# Patient Record
Sex: Female | Born: 1970 | Race: Black or African American | Hispanic: No | Marital: Married | Smoking: Never smoker
Health system: Southern US, Community
[De-identification: ages and names within clinical notes are randomized; demographics above are authoritative.]

---
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
## 2006-02-22 ENCOUNTER — Ambulatory Visit: Payer: Self-pay | Admitting: Family Medicine

## 2006-05-12 ENCOUNTER — Ambulatory Visit (HOSPITAL_COMMUNITY): Admission: RE | Admit: 2006-05-12 | Discharge: 2006-05-12 | Payer: Self-pay | Admitting: Gynecology

## 2006-05-18 ENCOUNTER — Ambulatory Visit: Payer: Self-pay | Admitting: Obstetrics & Gynecology

## 2006-06-08 ENCOUNTER — Ambulatory Visit: Payer: Self-pay | Admitting: Obstetrics & Gynecology

## 2006-06-22 ENCOUNTER — Ambulatory Visit: Payer: Self-pay | Admitting: Obstetrics & Gynecology

## 2006-06-23 ENCOUNTER — Ambulatory Visit: Payer: Self-pay | Admitting: Gynecology

## 2006-06-26 ENCOUNTER — Ambulatory Visit: Payer: Self-pay | Admitting: Obstetrics & Gynecology

## 2006-06-27 ENCOUNTER — Ambulatory Visit: Payer: Self-pay | Admitting: Obstetrics & Gynecology

## 2006-06-27 ENCOUNTER — Ambulatory Visit (HOSPITAL_COMMUNITY): Admission: RE | Admit: 2006-06-27 | Discharge: 2006-06-27 | Payer: Self-pay | Admitting: Obstetrics & Gynecology

## 2006-06-28 ENCOUNTER — Ambulatory Visit: Payer: Self-pay | Admitting: Family Medicine

## 2006-07-03 ENCOUNTER — Ambulatory Visit: Payer: Self-pay | Admitting: Family Medicine

## 2006-07-17 ENCOUNTER — Ambulatory Visit: Payer: Self-pay | Admitting: *Deleted

## 2006-07-20 ENCOUNTER — Ambulatory Visit (HOSPITAL_COMMUNITY): Admission: RE | Admit: 2006-07-20 | Discharge: 2006-07-20 | Payer: Self-pay | Admitting: Family Medicine

## 2006-07-31 ENCOUNTER — Ambulatory Visit: Payer: Self-pay | Admitting: Obstetrics & Gynecology

## 2006-08-07 ENCOUNTER — Other Ambulatory Visit: Payer: Self-pay | Admitting: Obstetrics & Gynecology

## 2006-08-07 ENCOUNTER — Encounter: Admission: RE | Admit: 2006-08-07 | Discharge: 2006-08-07 | Payer: Self-pay | Admitting: Obstetrics & Gynecology

## 2006-08-07 ENCOUNTER — Ambulatory Visit: Payer: Self-pay | Admitting: Obstetrics & Gynecology

## 2006-08-14 ENCOUNTER — Ambulatory Visit: Payer: Self-pay | Admitting: Obstetrics & Gynecology

## 2006-08-17 ENCOUNTER — Ambulatory Visit (HOSPITAL_COMMUNITY): Admission: RE | Admit: 2006-08-17 | Discharge: 2006-08-17 | Payer: Self-pay | Admitting: Family Medicine

## 2006-08-28 ENCOUNTER — Ambulatory Visit (HOSPITAL_COMMUNITY): Admission: RE | Admit: 2006-08-28 | Discharge: 2006-08-28 | Payer: Self-pay | Admitting: Family Medicine

## 2006-08-28 ENCOUNTER — Ambulatory Visit: Payer: Self-pay | Admitting: Obstetrics & Gynecology

## 2006-08-31 ENCOUNTER — Ambulatory Visit: Payer: Self-pay | Admitting: Family Medicine

## 2006-09-01 ENCOUNTER — Inpatient Hospital Stay (HOSPITAL_COMMUNITY): Admission: AD | Admit: 2006-09-01 | Discharge: 2006-09-04 | Payer: Self-pay | Admitting: Gynecology

## 2006-09-01 ENCOUNTER — Ambulatory Visit: Payer: Self-pay | Admitting: Gynecology

## 2006-09-01 ENCOUNTER — Encounter (INDEPENDENT_AMBULATORY_CARE_PROVIDER_SITE_OTHER): Payer: Self-pay | Admitting: Gynecology

## 2006-09-06 ENCOUNTER — Ambulatory Visit: Payer: Self-pay | Admitting: Physician Assistant

## 2006-09-06 ENCOUNTER — Inpatient Hospital Stay (HOSPITAL_COMMUNITY): Admission: AD | Admit: 2006-09-06 | Discharge: 2006-09-06 | Payer: Self-pay | Admitting: Gynecology

## 2007-03-01 ENCOUNTER — Emergency Department (HOSPITAL_COMMUNITY): Admission: EM | Admit: 2007-03-01 | Discharge: 2007-03-02 | Payer: Self-pay | Admitting: Emergency Medicine

## 2007-03-04 ENCOUNTER — Emergency Department (HOSPITAL_COMMUNITY): Admission: EM | Admit: 2007-03-04 | Discharge: 2007-03-04 | Payer: Self-pay | Admitting: Emergency Medicine

## 2007-03-13 ENCOUNTER — Ambulatory Visit: Payer: Self-pay | Admitting: Family Medicine

## 2007-03-15 ENCOUNTER — Ambulatory Visit: Payer: Self-pay | Admitting: *Deleted

## 2007-04-25 ENCOUNTER — Ambulatory Visit: Payer: Self-pay | Admitting: Family Medicine

## 2007-04-25 LAB — CONVERTED CEMR LAB
ALT: 21 units/L (ref 0–35)
AST: 20 units/L (ref 0–37)
Albumin: 4 g/dL (ref 3.5–5.2)
Alkaline Phosphatase: 72 units/L (ref 39–117)
Bilirubin, Direct: 0.1 mg/dL (ref 0.0–0.3)
Indirect Bilirubin: 0.3 mg/dL (ref 0.0–0.9)
Total Bilirubin: 0.4 mg/dL (ref 0.3–1.2)
Total Protein: 7.5 g/dL (ref 6.0–8.3)

## 2009-01-04 IMAGING — US US OB FOLLOW-UP EACH ADDL GEST (MODIFY)
1 series · 14 of 28 positions shown · non-contrast
Comparison: none

OBSTETRICAL ULTRASOUND:
 This ultrasound was performed in The [HOSPITAL], and the [facility] report will be stored to [REDACTED] PACS.

[Series 1: us ob follow-up each addl gest (modify) · 14 of 38 slices shown]
[im 2/38]
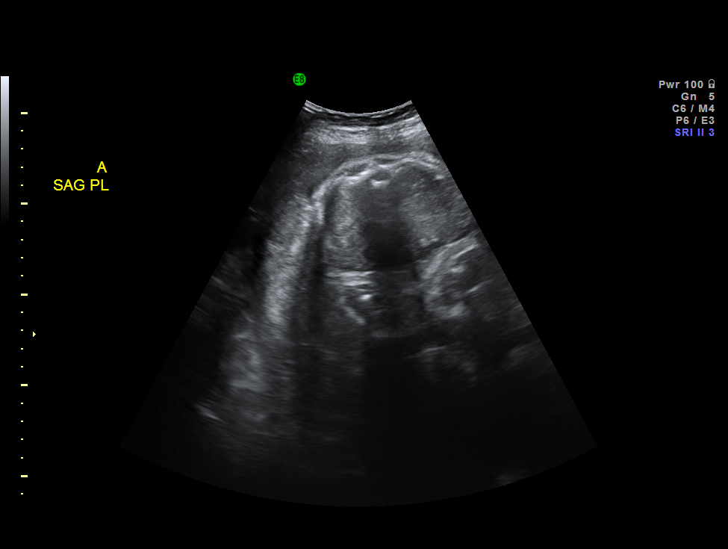
[im 5/38]
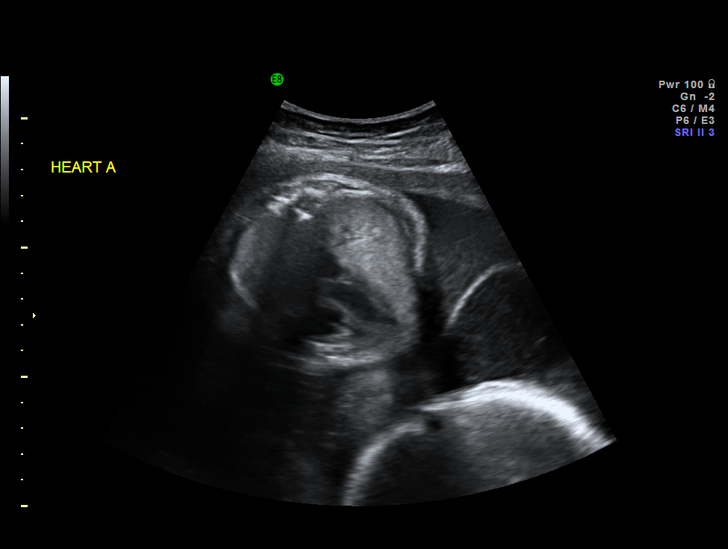
[im 7/38]
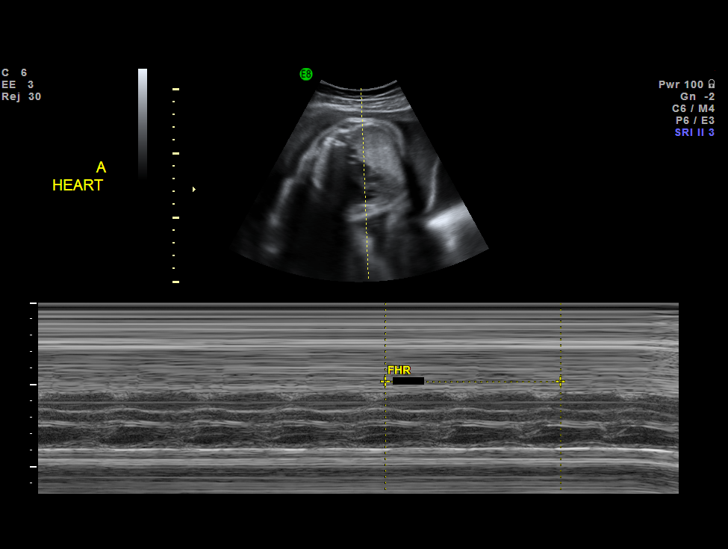
[im 10/38]
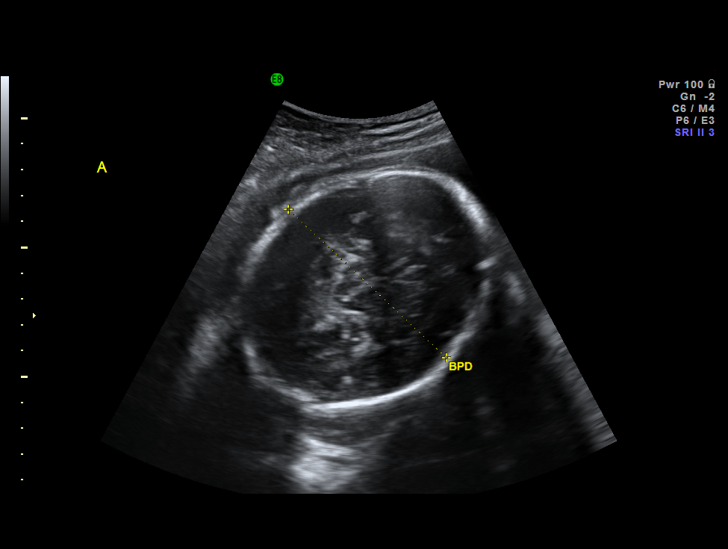
[im 13/38]
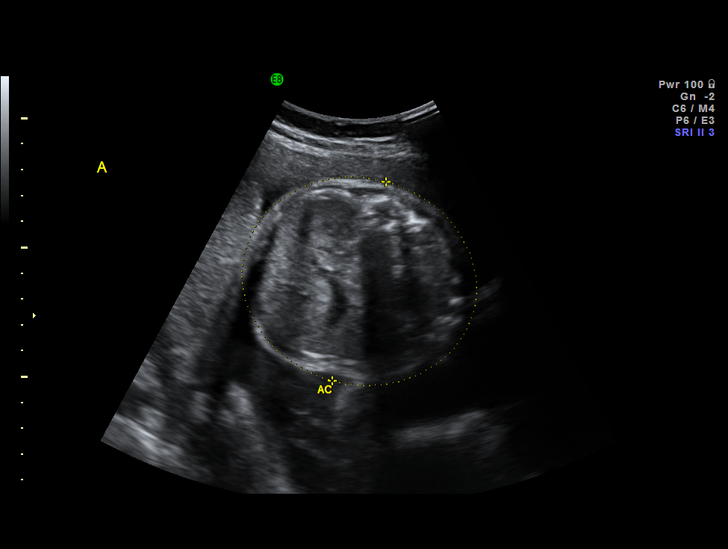
[im 16/38]
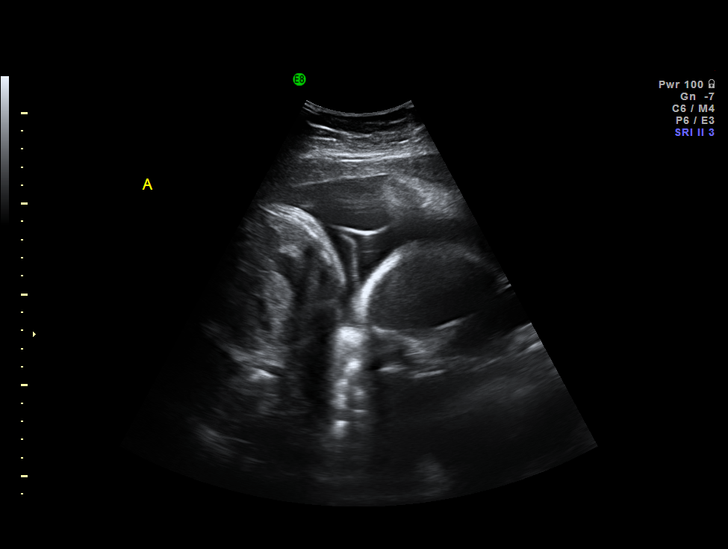
[im 18/38]
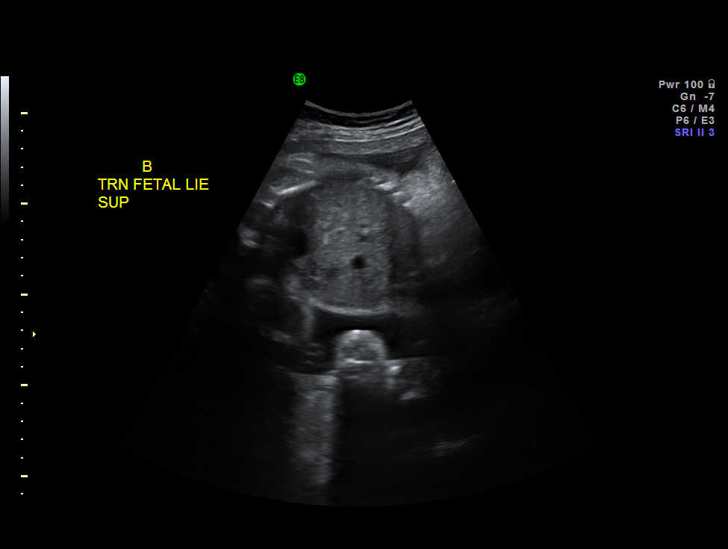
[im 21/38]
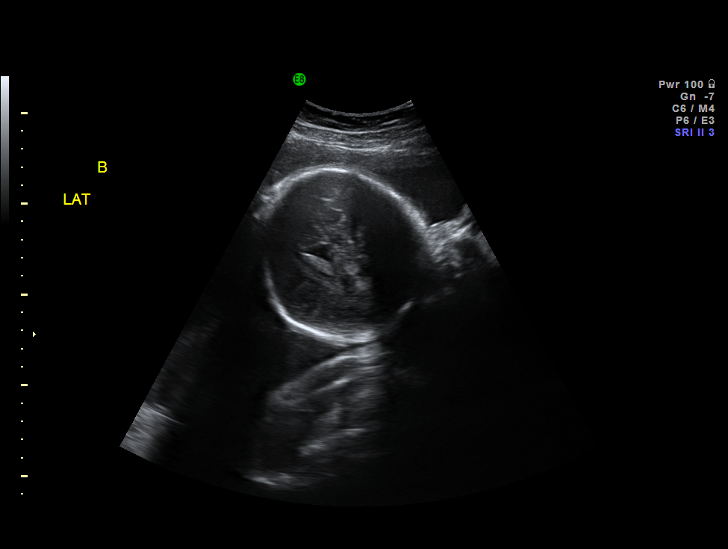
[im 24/38]
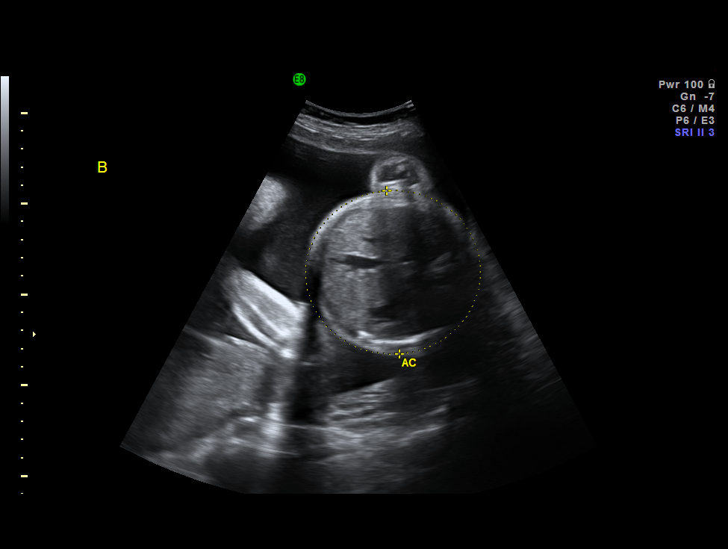
[im 27/38]
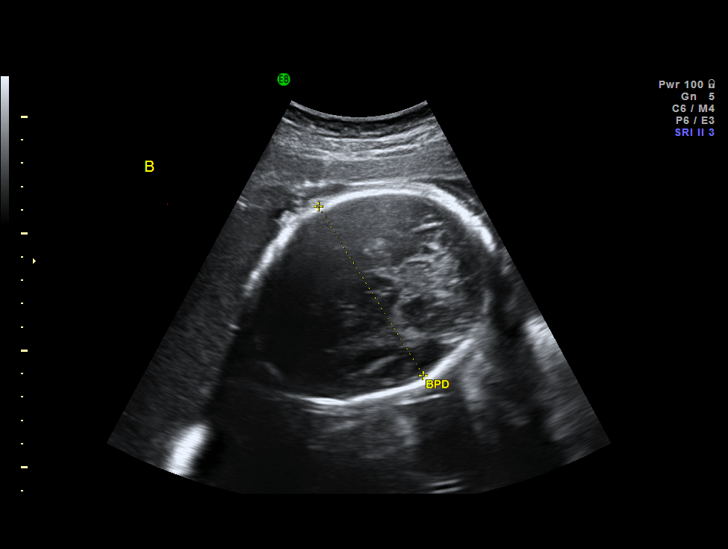
[im 29/38]
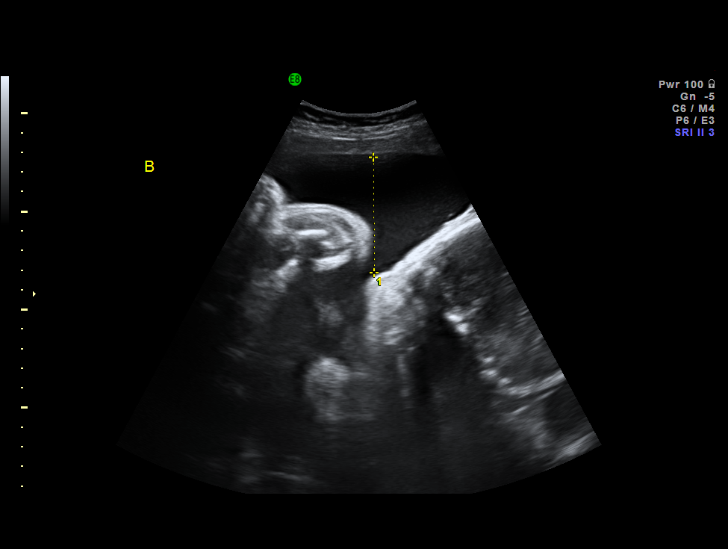
[im 32/38]
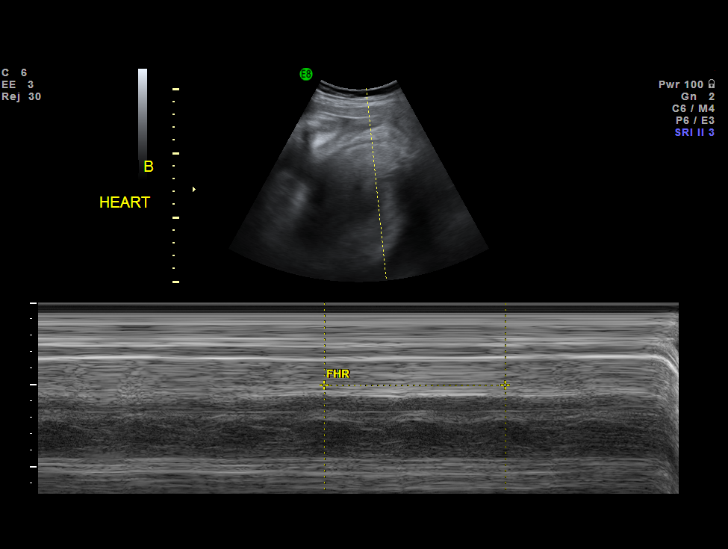
[im 35/38]
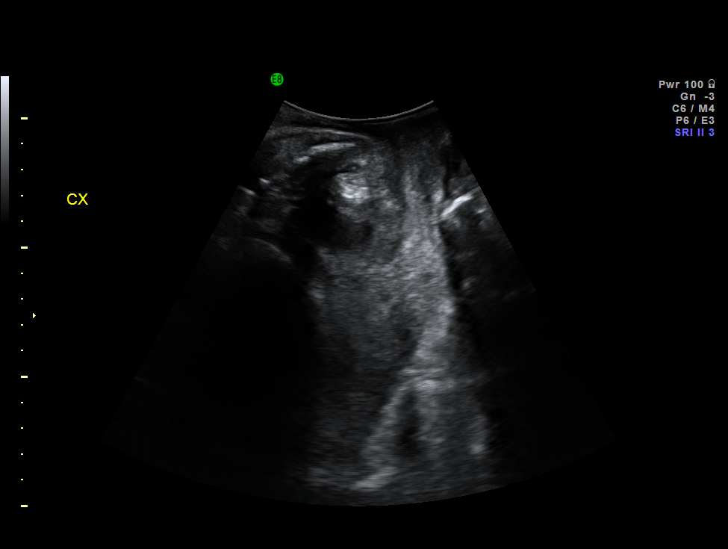
[im 38/38]
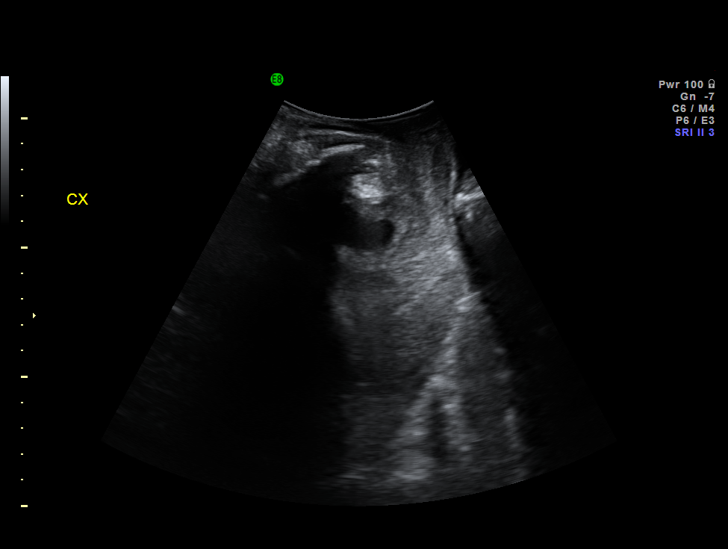

[14 of 28 positions shown; findings below may reference images not displayed]

IMPRESSION: The [facility] report has also been faxed to the ordering physician.

## 2009-01-09 ENCOUNTER — Ambulatory Visit: Payer: Self-pay | Admitting: Family Medicine

## 2009-01-09 LAB — CONVERTED CEMR LAB
ALT: 13 units/L (ref 0–35)
AST: 13 units/L (ref 0–37)
Albumin: 4 g/dL (ref 3.5–5.2)
Alkaline Phosphatase: 82 units/L (ref 39–117)
BUN: 8 mg/dL (ref 6–23)
Basophils Absolute: 0 10*3/uL (ref 0.0–0.1)
Basophils Relative: 1 % (ref 0–1)
CO2: 21 meq/L (ref 19–32)
Calcium: 9.3 mg/dL (ref 8.4–10.5)
Chloride: 102 meq/L (ref 96–112)
Creatinine, Ser: 0.55 mg/dL (ref 0.40–1.20)
Eosinophils Absolute: 0.1 10*3/uL (ref 0.0–0.7)
Eosinophils Relative: 1 % (ref 0–5)
Glucose, Bld: 86 mg/dL (ref 70–99)
HCT: 35.2 % — ABNORMAL LOW (ref 36.0–46.0)
Hemoglobin: 10.5 g/dL — ABNORMAL LOW (ref 12.0–15.0)
Lymphocytes Relative: 42 % (ref 12–46)
Lymphs Abs: 2.7 10*3/uL (ref 0.7–4.0)
MCHC: 29.8 g/dL — ABNORMAL LOW (ref 30.0–36.0)
MCV: 78.9 fL (ref 78.0–100.0)
Monocytes Absolute: 0.5 10*3/uL (ref 0.1–1.0)
Monocytes Relative: 7 % (ref 3–12)
Neutro Abs: 3.2 10*3/uL (ref 1.7–7.7)
Neutrophils Relative %: 50 % (ref 43–77)
Platelets: 487 10*3/uL — ABNORMAL HIGH (ref 150–400)
Potassium: 3.9 meq/L (ref 3.5–5.3)
RBC: 4.46 M/uL (ref 3.87–5.11)
RDW: 15.7 % — ABNORMAL HIGH (ref 11.5–15.5)
Sodium: 140 meq/L (ref 135–145)
Total Bilirubin: 0.3 mg/dL (ref 0.3–1.2)
Total Protein: 8.1 g/dL (ref 6.0–8.3)
Vit D, 25-Hydroxy: 9 ng/mL — ABNORMAL LOW (ref 30–89)
WBC: 6.5 10*3/uL (ref 4.0–10.5)

## 2009-04-10 ENCOUNTER — Ambulatory Visit: Payer: Self-pay | Admitting: Obstetrics and Gynecology

## 2010-01-29 ENCOUNTER — Ambulatory Visit (HOSPITAL_COMMUNITY)
Admission: RE | Admit: 2010-01-29 | Discharge: 2010-01-29 | Payer: Self-pay | Source: Home / Self Care | Admitting: Family Medicine

## 2010-02-04 ENCOUNTER — Ambulatory Visit: Payer: Self-pay | Admitting: Obstetrics & Gynecology

## 2010-02-25 ENCOUNTER — Ambulatory Visit: Payer: Self-pay | Admitting: Obstetrics & Gynecology

## 2010-03-01 ENCOUNTER — Ambulatory Visit
Admission: RE | Admit: 2010-03-01 | Discharge: 2010-03-01 | Payer: Self-pay | Source: Home / Self Care | Attending: Obstetrics & Gynecology | Admitting: Obstetrics & Gynecology

## 2010-03-02 ENCOUNTER — Encounter: Payer: Self-pay | Admitting: Obstetrics and Gynecology

## 2010-03-02 LAB — CONVERTED CEMR LAB
ALT: 9 units/L (ref 0–35)
AST: 12 units/L (ref 0–37)
Albumin: 3.3 g/dL — ABNORMAL LOW (ref 3.5–5.2)
Alkaline Phosphatase: 74 units/L (ref 39–117)
BUN: 5 mg/dL — ABNORMAL LOW (ref 6–23)
CO2: 21 meq/L (ref 19–32)
Calcium: 9.7 mg/dL (ref 8.4–10.5)
Chloride: 104 meq/L (ref 96–112)
Collection Interval-CRCL: 24 hr
Creatinine 24 HR UR: 2026 mg/24hr — ABNORMAL HIGH (ref 700–1800)
Creatinine Clearance: 293 mL/min — ABNORMAL HIGH (ref 75–115)
Creatinine, Ser: 0.48 mg/dL (ref 0.40–1.20)
Creatinine, Urine: 84.4 mg/dL
Glucose, Bld: 103 mg/dL — ABNORMAL HIGH (ref 70–99)
HCT: 30.4 % — ABNORMAL LOW (ref 36.0–46.0)
Hemoglobin: 9.8 g/dL — ABNORMAL LOW (ref 12.0–15.0)
MCHC: 32.2 g/dL (ref 30.0–36.0)
MCV: 88.1 fL (ref 78.0–100.0)
Platelets: 331 10*3/uL (ref 150–400)
Potassium: 4.2 meq/L (ref 3.5–5.3)
Protein, 24H Urine: 96 mg/24hr (ref 50–100)
RBC: 3.45 M/uL — ABNORMAL LOW (ref 3.87–5.11)
RDW: 14.8 % (ref 11.5–15.5)
Sodium: 137 meq/L (ref 135–145)
Total Bilirubin: 0.3 mg/dL (ref 0.3–1.2)
Total Protein: 6.7 g/dL (ref 6.0–8.3)
Uric Acid, Serum: 4.8 mg/dL (ref 2.4–7.0)
WBC: 11.7 10*3/uL — ABNORMAL HIGH (ref 4.0–10.5)

## 2010-03-04 ENCOUNTER — Ambulatory Visit
Admission: RE | Admit: 2010-03-04 | Discharge: 2010-03-04 | Payer: Self-pay | Source: Home / Self Care | Attending: Obstetrics & Gynecology | Admitting: Obstetrics & Gynecology

## 2010-03-04 LAB — POCT URINALYSIS DIPSTICK
Bilirubin Urine: NEGATIVE
Hemoglobin, Urine: NEGATIVE
Ketones, ur: NEGATIVE mg/dL
Nitrite: NEGATIVE
Protein, ur: 30 mg/dL — AB
Urine Glucose, Fasting: NEGATIVE mg/dL
Urobilinogen, UA: 1 mg/dL (ref 0.0–1.0)
pH: 6 (ref 5.0–8.0)

## 2010-03-18 ENCOUNTER — Encounter: Payer: Self-pay | Admitting: Obstetrics & Gynecology

## 2010-03-18 ENCOUNTER — Ambulatory Visit
Admission: RE | Admit: 2010-03-18 | Discharge: 2010-03-18 | Payer: Self-pay | Source: Home / Self Care | Attending: Obstetrics and Gynecology | Admitting: Obstetrics and Gynecology

## 2010-03-18 LAB — CONVERTED CEMR LAB
HCT: 31.2 % — ABNORMAL LOW (ref 36.0–46.0)
HIV: NONREACTIVE
Hemoglobin: 9.8 g/dL — ABNORMAL LOW (ref 12.0–15.0)
MCHC: 31.4 g/dL (ref 30.0–36.0)
MCV: 88.4 fL (ref 78.0–100.0)
Platelets: 305 10*3/uL (ref 150–400)
RBC: 3.53 M/uL — ABNORMAL LOW (ref 3.87–5.11)
RDW: 14.9 % (ref 11.5–15.5)
WBC: 11 10*3/uL — ABNORMAL HIGH (ref 4.0–10.5)

## 2010-03-21 ENCOUNTER — Encounter: Payer: Self-pay | Admitting: *Deleted

## 2010-03-22 LAB — POCT URINALYSIS DIPSTICK
Bilirubin Urine: NEGATIVE
Hgb urine dipstick: NEGATIVE
Ketones, ur: NEGATIVE mg/dL
Nitrite: NEGATIVE
Protein, ur: NEGATIVE mg/dL
Specific Gravity, Urine: 1.025 (ref 1.005–1.030)
Urine Glucose, Fasting: NEGATIVE mg/dL
Urobilinogen, UA: 0.2 mg/dL (ref 0.0–1.0)
pH: 6 (ref 5.0–8.0)

## 2010-03-24 ENCOUNTER — Ambulatory Visit (HOSPITAL_COMMUNITY)
Admission: RE | Admit: 2010-03-24 | Discharge: 2010-03-24 | Payer: Self-pay | Source: Home / Self Care | Attending: Family Medicine | Admitting: Family Medicine

## 2010-03-29 ENCOUNTER — Ambulatory Visit
Admission: RE | Admit: 2010-03-29 | Discharge: 2010-03-29 | Payer: Self-pay | Source: Home / Self Care | Attending: Obstetrics & Gynecology | Admitting: Obstetrics & Gynecology

## 2010-03-29 ENCOUNTER — Inpatient Hospital Stay (HOSPITAL_COMMUNITY)
Admission: AD | Admit: 2010-03-29 | Discharge: 2010-03-29 | Payer: Self-pay | Source: Home / Self Care | Attending: Family Medicine | Admitting: Family Medicine

## 2010-03-29 LAB — URINALYSIS, ROUTINE W REFLEX MICROSCOPIC
Bilirubin Urine: NEGATIVE
Hgb urine dipstick: NEGATIVE
Ketones, ur: >80 mg/dL — AB
Nitrite: NEGATIVE
Protein, ur: NEGATIVE mg/dL
Specific Gravity, Urine: 1.025 (ref 1.005–1.030)
Urine Glucose, Fasting: NEGATIVE mg/dL
Urobilinogen, UA: 0.2 mg/dL (ref 0.0–1.0)
pH: 5.5 (ref 5.0–8.0)

## 2010-04-01 ENCOUNTER — Ambulatory Visit: Admit: 2010-04-01 | Payer: Self-pay | Admitting: Obstetrics and Gynecology

## 2010-04-01 ENCOUNTER — Other Ambulatory Visit: Payer: Self-pay

## 2010-04-01 DIAGNOSIS — O409XX0 Polyhydramnios, unspecified trimester, not applicable or unspecified: Secondary | ICD-10-CM

## 2010-04-01 DIAGNOSIS — O09529 Supervision of elderly multigravida, unspecified trimester: Secondary | ICD-10-CM

## 2010-04-01 LAB — POCT URINALYSIS DIPSTICK
Bilirubin Urine: NEGATIVE
Hgb urine dipstick: NEGATIVE
Ketones, ur: NEGATIVE mg/dL
Nitrite: NEGATIVE
Protein, ur: NEGATIVE mg/dL
Specific Gravity, Urine: 1.015 (ref 1.005–1.030)
Urine Glucose, Fasting: NEGATIVE mg/dL
Urobilinogen, UA: 0.2 mg/dL (ref 0.0–1.0)
pH: 6.5 (ref 5.0–8.0)

## 2010-04-05 ENCOUNTER — Encounter: Payer: Medicaid Other | Attending: Obstetrics and Gynecology | Admitting: Dietician

## 2010-04-05 DIAGNOSIS — Z713 Dietary counseling and surveillance: Secondary | ICD-10-CM | POA: Insufficient documentation

## 2010-04-05 DIAGNOSIS — O99810 Abnormal glucose complicating pregnancy: Secondary | ICD-10-CM | POA: Insufficient documentation

## 2010-04-08 ENCOUNTER — Other Ambulatory Visit: Payer: Self-pay

## 2010-04-08 ENCOUNTER — Other Ambulatory Visit (HOSPITAL_COMMUNITY): Payer: Self-pay | Admitting: Maternal and Fetal Medicine

## 2010-04-08 ENCOUNTER — Other Ambulatory Visit: Payer: Self-pay | Admitting: Family Medicine

## 2010-04-08 ENCOUNTER — Ambulatory Visit (HOSPITAL_COMMUNITY)
Admission: RE | Admit: 2010-04-08 | Discharge: 2010-04-08 | Disposition: A | Discharge status: Home / Self Care | Payer: Medicaid Other | Source: Ambulatory Visit | Attending: Family Medicine | Admitting: Family Medicine

## 2010-04-08 ENCOUNTER — Ambulatory Visit (HOSPITAL_COMMUNITY): Payer: Self-pay

## 2010-04-08 DIAGNOSIS — O34219 Maternal care for unspecified type scar from previous cesarean delivery: Secondary | ICD-10-CM | POA: Insufficient documentation

## 2010-04-08 DIAGNOSIS — O24919 Unspecified diabetes mellitus in pregnancy, unspecified trimester: Secondary | ICD-10-CM

## 2010-04-08 DIAGNOSIS — O99810 Abnormal glucose complicating pregnancy: Secondary | ICD-10-CM

## 2010-04-08 DIAGNOSIS — O09299 Supervision of pregnancy with other poor reproductive or obstetric history, unspecified trimester: Secondary | ICD-10-CM | POA: Insufficient documentation

## 2010-04-08 DIAGNOSIS — E669 Obesity, unspecified: Secondary | ICD-10-CM | POA: Insufficient documentation

## 2010-04-08 DIAGNOSIS — O09529 Supervision of elderly multigravida, unspecified trimester: Secondary | ICD-10-CM | POA: Insufficient documentation

## 2010-04-08 DIAGNOSIS — O99210 Obesity complicating pregnancy, unspecified trimester: Secondary | ICD-10-CM | POA: Insufficient documentation

## 2010-04-08 DIAGNOSIS — Z8751 Personal history of pre-term labor: Secondary | ICD-10-CM | POA: Insufficient documentation

## 2010-04-08 LAB — POCT URINALYSIS DIPSTICK
Hgb urine dipstick: NEGATIVE
Nitrite: NEGATIVE
Protein, ur: 30 mg/dL — AB
Specific Gravity, Urine: 1.025 (ref 1.005–1.030)
Urine Glucose, Fasting: NEGATIVE mg/dL
Urobilinogen, UA: 1 mg/dL (ref 0.0–1.0)
pH: 6 (ref 5.0–8.0)

## 2010-04-15 ENCOUNTER — Other Ambulatory Visit (HOSPITAL_COMMUNITY): Payer: Self-pay

## 2010-04-16 ENCOUNTER — Encounter (HOSPITAL_COMMUNITY): Payer: Self-pay

## 2010-04-16 ENCOUNTER — Other Ambulatory Visit (HOSPITAL_COMMUNITY): Payer: Self-pay

## 2010-04-16 ENCOUNTER — Other Ambulatory Visit: Payer: Self-pay | Admitting: Family Medicine

## 2010-04-16 ENCOUNTER — Ambulatory Visit (HOSPITAL_COMMUNITY)
Admission: RE | Admit: 2010-04-16 | Discharge: 2010-04-16 | Disposition: A | Discharge status: Home / Self Care | Payer: Medicaid Other | Source: Ambulatory Visit | Attending: Maternal and Fetal Medicine | Admitting: Maternal and Fetal Medicine

## 2010-04-16 DIAGNOSIS — O34219 Maternal care for unspecified type scar from previous cesarean delivery: Secondary | ICD-10-CM | POA: Insufficient documentation

## 2010-04-16 DIAGNOSIS — O99210 Obesity complicating pregnancy, unspecified trimester: Secondary | ICD-10-CM | POA: Insufficient documentation

## 2010-04-16 DIAGNOSIS — O09529 Supervision of elderly multigravida, unspecified trimester: Secondary | ICD-10-CM | POA: Insufficient documentation

## 2010-04-16 DIAGNOSIS — O99810 Abnormal glucose complicating pregnancy: Secondary | ICD-10-CM | POA: Insufficient documentation

## 2010-04-16 DIAGNOSIS — E669 Obesity, unspecified: Secondary | ICD-10-CM | POA: Insufficient documentation

## 2010-04-20 ENCOUNTER — Ambulatory Visit (HOSPITAL_COMMUNITY)
Admission: RE | Admit: 2010-04-20 | Discharge: 2010-04-20 | Disposition: A | Discharge status: Home / Self Care | Payer: Medicaid Other | Source: Ambulatory Visit | Attending: Obstetrics & Gynecology | Admitting: Obstetrics & Gynecology

## 2010-04-20 DIAGNOSIS — O09529 Supervision of elderly multigravida, unspecified trimester: Secondary | ICD-10-CM | POA: Insufficient documentation

## 2010-04-20 DIAGNOSIS — O99810 Abnormal glucose complicating pregnancy: Secondary | ICD-10-CM | POA: Insufficient documentation

## 2010-04-20 DIAGNOSIS — E669 Obesity, unspecified: Secondary | ICD-10-CM | POA: Insufficient documentation

## 2010-04-20 DIAGNOSIS — O34219 Maternal care for unspecified type scar from previous cesarean delivery: Secondary | ICD-10-CM | POA: Insufficient documentation

## 2010-04-22 ENCOUNTER — Ambulatory Visit (HOSPITAL_COMMUNITY): Payer: Self-pay

## 2010-04-23 ENCOUNTER — Ambulatory Visit (HOSPITAL_COMMUNITY)
Admission: RE | Admit: 2010-04-23 | Discharge: 2010-04-23 | Disposition: A | Discharge status: Home / Self Care | Payer: Medicaid Other | Source: Ambulatory Visit | Attending: Family Medicine | Admitting: Family Medicine

## 2010-04-23 ENCOUNTER — Ambulatory Visit (HOSPITAL_COMMUNITY)
Admission: RE | Admit: 2010-04-23 | Discharge: 2010-04-23 | Disposition: A | Discharge status: Home / Self Care | Payer: Medicaid Other | Source: Ambulatory Visit | Attending: Obstetrics & Gynecology | Admitting: Obstetrics & Gynecology

## 2010-04-23 DIAGNOSIS — Z8751 Personal history of pre-term labor: Secondary | ICD-10-CM | POA: Insufficient documentation

## 2010-04-23 DIAGNOSIS — O34219 Maternal care for unspecified type scar from previous cesarean delivery: Secondary | ICD-10-CM | POA: Insufficient documentation

## 2010-04-23 DIAGNOSIS — O99810 Abnormal glucose complicating pregnancy: Secondary | ICD-10-CM | POA: Insufficient documentation

## 2010-04-23 DIAGNOSIS — O99210 Obesity complicating pregnancy, unspecified trimester: Secondary | ICD-10-CM | POA: Insufficient documentation

## 2010-04-23 DIAGNOSIS — O09529 Supervision of elderly multigravida, unspecified trimester: Secondary | ICD-10-CM

## 2010-04-23 DIAGNOSIS — E669 Obesity, unspecified: Secondary | ICD-10-CM | POA: Insufficient documentation

## 2010-04-23 DIAGNOSIS — O09299 Supervision of pregnancy with other poor reproductive or obstetric history, unspecified trimester: Secondary | ICD-10-CM | POA: Insufficient documentation

## 2010-04-26 ENCOUNTER — Other Ambulatory Visit: Payer: Self-pay

## 2010-04-26 ENCOUNTER — Encounter: Payer: Self-pay | Admitting: Obstetrics & Gynecology

## 2010-04-26 DIAGNOSIS — IMO0002 Reserved for concepts with insufficient information to code with codable children: Secondary | ICD-10-CM

## 2010-04-26 DIAGNOSIS — O409XX0 Polyhydramnios, unspecified trimester, not applicable or unspecified: Secondary | ICD-10-CM

## 2010-04-26 DIAGNOSIS — O34219 Maternal care for unspecified type scar from previous cesarean delivery: Secondary | ICD-10-CM

## 2010-04-26 DIAGNOSIS — O09529 Supervision of elderly multigravida, unspecified trimester: Secondary | ICD-10-CM

## 2010-04-26 LAB — POCT URINALYSIS DIPSTICK
Nitrite: NEGATIVE
Protein, ur: 30 mg/dL — AB
Urobilinogen, UA: 1 mg/dL (ref 0.0–1.0)

## 2010-04-26 LAB — CONVERTED CEMR LAB: TSH: 1.291 microintl units/mL (ref 0.350–4.500)

## 2010-04-27 ENCOUNTER — Ambulatory Visit (HOSPITAL_COMMUNITY)
Admission: RE | Admit: 2010-04-27 | Discharge: 2010-04-27 | Disposition: A | Discharge status: Home / Self Care | Payer: Medicaid Other | Source: Ambulatory Visit | Attending: Family Medicine | Admitting: Family Medicine

## 2010-04-27 ENCOUNTER — Ambulatory Visit (HOSPITAL_COMMUNITY): Payer: Medicaid Other

## 2010-04-27 ENCOUNTER — Other Ambulatory Visit: Payer: Self-pay | Admitting: Family Medicine

## 2010-04-27 ENCOUNTER — Ambulatory Visit (HOSPITAL_COMMUNITY)
Admission: RE | Admit: 2010-04-27 | Discharge: 2010-04-27 | Disposition: A | Discharge status: Home / Self Care | Payer: Medicaid Other | Source: Ambulatory Visit | Attending: Obstetrics & Gynecology | Admitting: Obstetrics & Gynecology

## 2010-04-27 DIAGNOSIS — O09529 Supervision of elderly multigravida, unspecified trimester: Secondary | ICD-10-CM | POA: Insufficient documentation

## 2010-04-27 DIAGNOSIS — O99810 Abnormal glucose complicating pregnancy: Secondary | ICD-10-CM | POA: Insufficient documentation

## 2010-04-27 DIAGNOSIS — E669 Obesity, unspecified: Secondary | ICD-10-CM | POA: Insufficient documentation

## 2010-04-27 DIAGNOSIS — O289 Unspecified abnormal findings on antenatal screening of mother: Secondary | ICD-10-CM

## 2010-04-27 DIAGNOSIS — Z8751 Personal history of pre-term labor: Secondary | ICD-10-CM | POA: Insufficient documentation

## 2010-04-27 DIAGNOSIS — O09299 Supervision of pregnancy with other poor reproductive or obstetric history, unspecified trimester: Secondary | ICD-10-CM | POA: Insufficient documentation

## 2010-04-29 ENCOUNTER — Ambulatory Visit (HOSPITAL_COMMUNITY): Payer: Self-pay

## 2010-04-30 ENCOUNTER — Ambulatory Visit (HOSPITAL_COMMUNITY)
Admission: RE | Admit: 2010-04-30 | Discharge: 2010-04-30 | Disposition: A | Discharge status: Home / Self Care | Payer: Medicaid Other | Source: Ambulatory Visit | Attending: Family Medicine | Admitting: Family Medicine

## 2010-04-30 ENCOUNTER — Other Ambulatory Visit: Payer: Self-pay | Admitting: Family Medicine

## 2010-04-30 ENCOUNTER — Ambulatory Visit (HOSPITAL_COMMUNITY)
Admission: RE | Admit: 2010-04-30 | Discharge: 2010-04-30 | Disposition: A | Discharge status: Home / Self Care | Payer: Medicaid Other | Source: Ambulatory Visit | Attending: Obstetrics & Gynecology | Admitting: Obstetrics & Gynecology

## 2010-04-30 DIAGNOSIS — O09299 Supervision of pregnancy with other poor reproductive or obstetric history, unspecified trimester: Secondary | ICD-10-CM | POA: Insufficient documentation

## 2010-04-30 DIAGNOSIS — O09529 Supervision of elderly multigravida, unspecified trimester: Secondary | ICD-10-CM | POA: Insufficient documentation

## 2010-04-30 DIAGNOSIS — O99210 Obesity complicating pregnancy, unspecified trimester: Secondary | ICD-10-CM | POA: Insufficient documentation

## 2010-04-30 DIAGNOSIS — O34219 Maternal care for unspecified type scar from previous cesarean delivery: Secondary | ICD-10-CM

## 2010-04-30 DIAGNOSIS — O99810 Abnormal glucose complicating pregnancy: Secondary | ICD-10-CM

## 2010-04-30 DIAGNOSIS — E669 Obesity, unspecified: Secondary | ICD-10-CM

## 2010-04-30 DIAGNOSIS — O288 Other abnormal findings on antenatal screening of mother: Secondary | ICD-10-CM

## 2010-05-03 ENCOUNTER — Other Ambulatory Visit: Payer: Self-pay

## 2010-05-03 ENCOUNTER — Encounter: Payer: Medicaid Other | Attending: Obstetrics and Gynecology | Admitting: Dietician

## 2010-05-03 DIAGNOSIS — Z713 Dietary counseling and surveillance: Secondary | ICD-10-CM | POA: Insufficient documentation

## 2010-05-03 DIAGNOSIS — O409XX0 Polyhydramnios, unspecified trimester, not applicable or unspecified: Secondary | ICD-10-CM

## 2010-05-03 DIAGNOSIS — O99810 Abnormal glucose complicating pregnancy: Secondary | ICD-10-CM | POA: Insufficient documentation

## 2010-05-03 LAB — POCT URINALYSIS DIPSTICK
Glucose, UA: NEGATIVE mg/dL
Nitrite: NEGATIVE
Protein, ur: NEGATIVE mg/dL
Urobilinogen, UA: 1 mg/dL (ref 0.0–1.0)

## 2010-05-04 ENCOUNTER — Other Ambulatory Visit: Payer: Self-pay | Admitting: Family Medicine

## 2010-05-04 ENCOUNTER — Other Ambulatory Visit (HOSPITAL_COMMUNITY): Payer: Medicaid Other

## 2010-05-04 ENCOUNTER — Ambulatory Visit (HOSPITAL_COMMUNITY)
Admission: RE | Admit: 2010-05-04 | Discharge: 2010-05-04 | Disposition: A | Discharge status: Home / Self Care | Payer: Medicaid Other | Source: Ambulatory Visit | Attending: Family Medicine | Admitting: Family Medicine

## 2010-05-04 ENCOUNTER — Ambulatory Visit (HOSPITAL_COMMUNITY): Payer: Medicaid Other

## 2010-05-04 ENCOUNTER — Ambulatory Visit (HOSPITAL_COMMUNITY)
Admission: RE | Admit: 2010-05-04 | Discharge: 2010-05-04 | Disposition: A | Discharge status: Home / Self Care | Payer: Medicaid Other | Source: Ambulatory Visit | Attending: Obstetrics & Gynecology | Admitting: Obstetrics & Gynecology

## 2010-05-04 DIAGNOSIS — O09529 Supervision of elderly multigravida, unspecified trimester: Secondary | ICD-10-CM

## 2010-05-04 DIAGNOSIS — O99810 Abnormal glucose complicating pregnancy: Secondary | ICD-10-CM

## 2010-05-04 DIAGNOSIS — E669 Obesity, unspecified: Secondary | ICD-10-CM | POA: Insufficient documentation

## 2010-05-04 DIAGNOSIS — O34219 Maternal care for unspecified type scar from previous cesarean delivery: Secondary | ICD-10-CM

## 2010-05-04 DIAGNOSIS — O09299 Supervision of pregnancy with other poor reproductive or obstetric history, unspecified trimester: Secondary | ICD-10-CM | POA: Insufficient documentation

## 2010-05-07 ENCOUNTER — Ambulatory Visit (HOSPITAL_COMMUNITY)
Admission: RE | Admit: 2010-05-07 | Discharge: 2010-05-07 | Disposition: A | Discharge status: Home / Self Care | Payer: Medicaid Other | Source: Ambulatory Visit | Attending: Obstetrics & Gynecology | Admitting: Obstetrics & Gynecology

## 2010-05-07 ENCOUNTER — Other Ambulatory Visit: Payer: Self-pay | Admitting: Family Medicine

## 2010-05-07 DIAGNOSIS — O99810 Abnormal glucose complicating pregnancy: Secondary | ICD-10-CM

## 2010-05-07 DIAGNOSIS — E669 Obesity, unspecified: Secondary | ICD-10-CM | POA: Insufficient documentation

## 2010-05-07 DIAGNOSIS — O09529 Supervision of elderly multigravida, unspecified trimester: Secondary | ICD-10-CM | POA: Insufficient documentation

## 2010-05-07 DIAGNOSIS — O09299 Supervision of pregnancy with other poor reproductive or obstetric history, unspecified trimester: Secondary | ICD-10-CM | POA: Insufficient documentation

## 2010-05-07 DIAGNOSIS — O34219 Maternal care for unspecified type scar from previous cesarean delivery: Secondary | ICD-10-CM

## 2010-05-10 ENCOUNTER — Other Ambulatory Visit: Payer: Self-pay

## 2010-05-10 DIAGNOSIS — IMO0002 Reserved for concepts with insufficient information to code with codable children: Secondary | ICD-10-CM

## 2010-05-10 DIAGNOSIS — O34219 Maternal care for unspecified type scar from previous cesarean delivery: Secondary | ICD-10-CM

## 2010-05-10 DIAGNOSIS — O09529 Supervision of elderly multigravida, unspecified trimester: Secondary | ICD-10-CM

## 2010-05-10 DIAGNOSIS — O409XX0 Polyhydramnios, unspecified trimester, not applicable or unspecified: Secondary | ICD-10-CM

## 2010-05-10 LAB — POCT URINALYSIS DIPSTICK
Bilirubin Urine: NEGATIVE
Glucose, UA: NEGATIVE mg/dL
Glucose, UA: NEGATIVE mg/dL
Glucose, UA: NEGATIVE mg/dL
Hgb urine dipstick: NEGATIVE
Nitrite: NEGATIVE
Protein, ur: 30 mg/dL — AB
Specific Gravity, Urine: >=1.03 (ref 1.005–1.030)
Urobilinogen, UA: 1 mg/dL (ref 0.0–1.0)
Urobilinogen, UA: 1 mg/dL (ref 0.0–1.0)

## 2010-05-11 ENCOUNTER — Ambulatory Visit (HOSPITAL_COMMUNITY)
Admission: RE | Admit: 2010-05-11 | Discharge: 2010-05-11 | Disposition: A | Discharge status: Home / Self Care | Payer: Medicaid Other | Source: Ambulatory Visit | Attending: Family Medicine | Admitting: Family Medicine

## 2010-05-11 DIAGNOSIS — O09529 Supervision of elderly multigravida, unspecified trimester: Secondary | ICD-10-CM | POA: Insufficient documentation

## 2010-05-11 DIAGNOSIS — O99810 Abnormal glucose complicating pregnancy: Secondary | ICD-10-CM | POA: Insufficient documentation

## 2010-05-11 DIAGNOSIS — O09299 Supervision of pregnancy with other poor reproductive or obstetric history, unspecified trimester: Secondary | ICD-10-CM | POA: Insufficient documentation

## 2010-05-14 ENCOUNTER — Ambulatory Visit (HOSPITAL_COMMUNITY)
Admission: RE | Admit: 2010-05-14 | Discharge: 2010-05-14 | Disposition: A | Discharge status: Home / Self Care | Payer: Medicaid Other | Source: Ambulatory Visit | Attending: Family Medicine | Admitting: Family Medicine

## 2010-05-14 DIAGNOSIS — O09529 Supervision of elderly multigravida, unspecified trimester: Secondary | ICD-10-CM | POA: Insufficient documentation

## 2010-05-14 DIAGNOSIS — O09299 Supervision of pregnancy with other poor reproductive or obstetric history, unspecified trimester: Secondary | ICD-10-CM | POA: Insufficient documentation

## 2010-05-14 DIAGNOSIS — O99810 Abnormal glucose complicating pregnancy: Secondary | ICD-10-CM | POA: Insufficient documentation

## 2010-05-17 ENCOUNTER — Encounter: Payer: Self-pay | Admitting: Obstetrics & Gynecology

## 2010-05-17 ENCOUNTER — Other Ambulatory Visit: Payer: Self-pay

## 2010-05-17 DIAGNOSIS — O34219 Maternal care for unspecified type scar from previous cesarean delivery: Secondary | ICD-10-CM

## 2010-05-17 DIAGNOSIS — O99810 Abnormal glucose complicating pregnancy: Secondary | ICD-10-CM

## 2010-05-17 DIAGNOSIS — O409XX0 Polyhydramnios, unspecified trimester, not applicable or unspecified: Secondary | ICD-10-CM

## 2010-05-17 LAB — POCT URINALYSIS DIP (DEVICE)
Bilirubin Urine: NEGATIVE
Hgb urine dipstick: NEGATIVE
Nitrite: NEGATIVE
pH: 6.5 (ref 5.0–8.0)

## 2010-05-18 ENCOUNTER — Encounter (HOSPITAL_COMMUNITY)
Admission: RE | Admit: 2010-05-18 | Discharge: 2010-05-18 | Disposition: A | Discharge status: Home / Self Care | Payer: Medicaid Other | Source: Ambulatory Visit | Attending: Family Medicine | Admitting: Family Medicine

## 2010-05-18 DIAGNOSIS — O10019 Pre-existing essential hypertension complicating pregnancy, unspecified trimester: Secondary | ICD-10-CM | POA: Insufficient documentation

## 2010-05-21 ENCOUNTER — Ambulatory Visit (HOSPITAL_COMMUNITY)
Admission: RE | Admit: 2010-05-21 | Discharge: 2010-05-21 | Disposition: A | Discharge status: Home / Self Care | Payer: Medicaid Other | Source: Ambulatory Visit | Attending: Family Medicine | Admitting: Family Medicine

## 2010-05-21 ENCOUNTER — Other Ambulatory Visit (HOSPITAL_COMMUNITY): Payer: Medicaid Other

## 2010-05-21 DIAGNOSIS — E669 Obesity, unspecified: Secondary | ICD-10-CM

## 2010-05-21 DIAGNOSIS — O10019 Pre-existing essential hypertension complicating pregnancy, unspecified trimester: Secondary | ICD-10-CM | POA: Insufficient documentation

## 2010-05-21 DIAGNOSIS — O09529 Supervision of elderly multigravida, unspecified trimester: Secondary | ICD-10-CM

## 2010-05-21 DIAGNOSIS — O99810 Abnormal glucose complicating pregnancy: Secondary | ICD-10-CM

## 2010-05-21 DIAGNOSIS — O34219 Maternal care for unspecified type scar from previous cesarean delivery: Secondary | ICD-10-CM

## 2010-05-24 ENCOUNTER — Other Ambulatory Visit: Payer: Self-pay | Admitting: Obstetrics and Gynecology

## 2010-05-24 DIAGNOSIS — IMO0002 Reserved for concepts with insufficient information to code with codable children: Secondary | ICD-10-CM

## 2010-05-24 DIAGNOSIS — Z331 Pregnant state, incidental: Secondary | ICD-10-CM

## 2010-05-24 DIAGNOSIS — O34219 Maternal care for unspecified type scar from previous cesarean delivery: Secondary | ICD-10-CM

## 2010-05-24 DIAGNOSIS — O409XX0 Polyhydramnios, unspecified trimester, not applicable or unspecified: Secondary | ICD-10-CM

## 2010-05-24 DIAGNOSIS — O09529 Supervision of elderly multigravida, unspecified trimester: Secondary | ICD-10-CM

## 2010-05-24 LAB — POCT URINALYSIS DIP (DEVICE)
Protein, ur: 30 mg/dL — AB
Specific Gravity, Urine: 1.02 (ref 1.005–1.030)
pH: 6 (ref 5.0–8.0)

## 2010-05-25 ENCOUNTER — Ambulatory Visit (HOSPITAL_COMMUNITY)
Admission: RE | Admit: 2010-05-25 | Discharge: 2010-05-25 | Disposition: A | Discharge status: Home / Self Care | Payer: Medicaid Other | Source: Ambulatory Visit | Attending: Obstetrics & Gynecology | Admitting: Obstetrics & Gynecology

## 2010-05-25 DIAGNOSIS — O09529 Supervision of elderly multigravida, unspecified trimester: Secondary | ICD-10-CM | POA: Insufficient documentation

## 2010-05-25 DIAGNOSIS — O99810 Abnormal glucose complicating pregnancy: Secondary | ICD-10-CM | POA: Insufficient documentation

## 2010-05-25 DIAGNOSIS — O09299 Supervision of pregnancy with other poor reproductive or obstetric history, unspecified trimester: Secondary | ICD-10-CM | POA: Insufficient documentation

## 2010-05-28 ENCOUNTER — Ambulatory Visit (HOSPITAL_COMMUNITY)
Admission: RE | Admit: 2010-05-28 | Discharge: 2010-05-28 | Disposition: A | Discharge status: Home / Self Care | Payer: Medicaid Other | Source: Ambulatory Visit | Attending: Family Medicine | Admitting: Family Medicine

## 2010-05-28 DIAGNOSIS — O10019 Pre-existing essential hypertension complicating pregnancy, unspecified trimester: Secondary | ICD-10-CM | POA: Insufficient documentation

## 2010-05-31 ENCOUNTER — Ambulatory Visit (HOSPITAL_COMMUNITY)
Admission: RE | Admit: 2010-05-31 | Discharge: 2010-05-31 | Disposition: A | Discharge status: Home / Self Care | Payer: Medicaid Other | Source: Ambulatory Visit | Attending: Family Medicine | Admitting: Family Medicine

## 2010-05-31 ENCOUNTER — Other Ambulatory Visit: Payer: Self-pay | Admitting: Family Medicine

## 2010-05-31 ENCOUNTER — Other Ambulatory Visit: Payer: Self-pay | Admitting: Obstetrics & Gynecology

## 2010-05-31 DIAGNOSIS — O34219 Maternal care for unspecified type scar from previous cesarean delivery: Secondary | ICD-10-CM

## 2010-05-31 DIAGNOSIS — O99810 Abnormal glucose complicating pregnancy: Secondary | ICD-10-CM

## 2010-05-31 DIAGNOSIS — IMO0002 Reserved for concepts with insufficient information to code with codable children: Secondary | ICD-10-CM

## 2010-05-31 DIAGNOSIS — Z331 Pregnant state, incidental: Secondary | ICD-10-CM

## 2010-05-31 DIAGNOSIS — O09529 Supervision of elderly multigravida, unspecified trimester: Secondary | ICD-10-CM

## 2010-05-31 DIAGNOSIS — O10019 Pre-existing essential hypertension complicating pregnancy, unspecified trimester: Secondary | ICD-10-CM | POA: Insufficient documentation

## 2010-05-31 DIAGNOSIS — E669 Obesity, unspecified: Secondary | ICD-10-CM

## 2010-05-31 DIAGNOSIS — O409XX0 Polyhydramnios, unspecified trimester, not applicable or unspecified: Secondary | ICD-10-CM

## 2010-05-31 LAB — POCT URINALYSIS DIP (DEVICE)
Bilirubin Urine: NEGATIVE
Hgb urine dipstick: NEGATIVE
Ketones, ur: NEGATIVE mg/dL
Specific Gravity, Urine: 1.015 (ref 1.005–1.030)
pH: 6 (ref 5.0–8.0)

## 2010-06-03 ENCOUNTER — Other Ambulatory Visit: Payer: Medicaid Other

## 2010-06-03 ENCOUNTER — Ambulatory Visit (HOSPITAL_COMMUNITY): Payer: Medicaid Other

## 2010-06-03 DIAGNOSIS — O99810 Abnormal glucose complicating pregnancy: Secondary | ICD-10-CM

## 2010-06-03 DIAGNOSIS — O09529 Supervision of elderly multigravida, unspecified trimester: Secondary | ICD-10-CM

## 2010-06-03 DIAGNOSIS — Z331 Pregnant state, incidental: Secondary | ICD-10-CM

## 2010-06-07 ENCOUNTER — Observation Stay (HOSPITAL_COMMUNITY)
Admission: RE | Admit: 2010-06-07 | Discharge: 2010-06-07 | Disposition: A | Discharge status: Home / Self Care | Payer: Medicaid Other | Source: Ambulatory Visit | Attending: Obstetrics & Gynecology | Admitting: Obstetrics & Gynecology

## 2010-06-07 ENCOUNTER — Ambulatory Visit (HOSPITAL_COMMUNITY): Payer: Medicaid Other

## 2010-06-07 DIAGNOSIS — O99810 Abnormal glucose complicating pregnancy: Principal | ICD-10-CM | POA: Insufficient documentation

## 2010-06-07 LAB — CBC
HCT: 35.9 % — ABNORMAL LOW (ref 36.0–46.0)
Hemoglobin: 12.1 g/dL (ref 12.0–15.0)
MCH: 30.2 pg (ref 26.0–34.0)
MCHC: 33.7 g/dL (ref 30.0–36.0)
MCV: 89.5 fL (ref 78.0–100.0)

## 2010-06-09 ENCOUNTER — Inpatient Hospital Stay (HOSPITAL_COMMUNITY)
Admission: AD | Admit: 2010-06-09 | Discharge: 2010-06-12 | DRG: 766 | Disposition: A | Discharge status: Home / Self Care | Payer: Medicaid Other | Source: Ambulatory Visit | Attending: Obstetrics and Gynecology | Admitting: Obstetrics and Gynecology

## 2010-06-09 DIAGNOSIS — O4290 Premature rupture of membranes, unspecified as to length of time between rupture and onset of labor, unspecified weeks of gestation: Secondary | ICD-10-CM | POA: Diagnosis present

## 2010-06-09 DIAGNOSIS — O34219 Maternal care for unspecified type scar from previous cesarean delivery: Secondary | ICD-10-CM

## 2010-06-09 DIAGNOSIS — O09529 Supervision of elderly multigravida, unspecified trimester: Secondary | ICD-10-CM

## 2010-06-09 LAB — COMPREHENSIVE METABOLIC PANEL
ALT: 12 U/L (ref 0–35)
CO2: 21 mEq/L (ref 19–32)
Calcium: 8.8 mg/dL (ref 8.4–10.5)
Creatinine, Ser: 0.52 mg/dL (ref 0.4–1.2)
GFR calc non Af Amer: >60 mL/min (ref >60)
Glucose, Bld: 89 mg/dL (ref 70–99)
Sodium: 130 mEq/L — ABNORMAL LOW (ref 135–145)
Total Bilirubin: 0.5 mg/dL (ref 0.3–1.2)

## 2010-06-09 LAB — CBC
HCT: 36.4 % (ref 36.0–46.0)
Hemoglobin: 12.4 g/dL (ref 12.0–15.0)
MCH: 30.5 pg (ref 26.0–34.0)
MCHC: 34.1 g/dL (ref 30.0–36.0)

## 2010-06-09 LAB — RPR: RPR Ser Ql: NONREACTIVE

## 2010-06-10 ENCOUNTER — Other Ambulatory Visit: Payer: Medicaid Other

## 2010-06-10 ENCOUNTER — Other Ambulatory Visit (HOSPITAL_COMMUNITY): Payer: Medicaid Other

## 2010-06-10 LAB — CBC
HCT: 29.6 % — ABNORMAL LOW (ref 36.0–46.0)
Hemoglobin: 9.7 g/dL — ABNORMAL LOW (ref 12.0–15.0)
MCV: 90.2 fL (ref 78.0–100.0)
Platelets: 185 10*3/uL (ref 150–400)
RBC: 3.28 MIL/uL — ABNORMAL LOW (ref 3.87–5.11)
WBC: 9.8 10*3/uL (ref 4.0–10.5)

## 2010-06-11 NOTE — Op Note (Signed)
Shannon Bowers, Shannon Bowers               ACCOUNT NO.:  [id number]  MEDICAL RECORD NO.:  [id number]           PATIENT TYPE:  I  LOCATION:  9101                          [facility]  PHYSICIAN:  Tilda Burrow, M.D. DATE OF BIRTH:  1970-05-07  DATE OF PROCEDURE:  06/09/2010 DATE OF DISCHARGE:                              OPERATIVE REPORT   PREOPERATIVE DIAGNOSES:  Pregnancy 39 plus 2 weeks, prior cesarean section, failed vaginal birth after cesarean secondary to cephalopelvic disproportion, meconium staining,  Bladder flap adhesions to uterus.  PROCEDURE:  Repeat low-transverse cervical cesarean section, placement of Intercede over anterior uterine adhesions.  SURGEON:  Tilda Burrow, MD ASSISTANT:  None.  ANESTHESIA:  Spinal after failed epidural.  FINDINGS:  7-1/2 pound infant, vertex, out of pelvis, thick lower uterine segment with adhesions to anterior wall, placenta to Labor and Delivery.  Cord blood pH gas 7.29.  ESTIMATED BLOOD LOSS:  800 mL. COMPLICATIONS:  None.  The patient to PACU in good condition.  INDICATIONS:  Arrest of labor at 3 cm, 50%, -2 to -3 after laboring since 8:00 p.m. last night and actively augmented with intrauterine pressure catheters and scalp electrode over the last 8 hours.  DETAILS OF PROCEDURE:  The patient was taken to the operating room, prepped and draped for lower abdominal surgery.  Time-out conducted and confirmed by involved parties.  Ancef 1 g IV administered preprocedure. Transverse lower abdominal incision was repeated through the old scar, the Bovie cautery used to dissect through the fatty tissue and fibrotic tissues.  The Pfannenstiel-type incision performed at the level of the fascia, and the peritoneal cavity entered in the midline with dense firm adhesions encountered from the anterior abdominal wall to the anterior lower uterine segment.  These were sufficiently dissected away from the uterus to allow access to the lower  uterine segment.  Fetal vertex remained out of the pelvis.  Transverse uterine incision was made down to the amniotic fluid, which was meconium discolored of a long, chronic nature with particulate vernix mixed with in the meconium.  The fetal vertex was rotated in the incision.  The baby cried promptly, bulb suctioning was performed to remove some of the nasal and pharyngeal debris, and then the infant passed to the pediatrician in good condition.  PH 7.29 was obtained on an arterial blood gas sample. Placenta delivered from the uterus gradually in response to Crede massage.  Membranes were extracted separately but were completely removed upon inspection of the uterus.  The uterus was closed with a single layer of running locking closure with good tissue reapproximation and hemostasis.  Several spots on the anterior uterus required point cautery to achieve hemostasis and one required suture of 0 chromic. This was followed by placement of intercede one-half sheet over the vesicouterine reflection where the peritoneum used to be, and then the anterior peritoneum closed using running 2-0 Vicryl.  Fascia was closed with running 0 Vicryl.  Subcu tissues were reapproximated with loosening of the inferior fibrotic tissues, 3 sutures of 2-0 Vicryl to pull the skin and fatty tissue and approximation, and then subcuticular 4-0 Vicryl used  to close the skin completely.  The sponge and needle counts were correct throughout.     Tilda Burrow, M.D.     JVF/MEDQ  D:  06/09/2010  T:  06/10/2010  Job:  [id number]  Electronically Signed by Christin Bach M.D. on 06/11/2010 02:44:06 PM

## 2010-07-07 ENCOUNTER — Ambulatory Visit (INDEPENDENT_AMBULATORY_CARE_PROVIDER_SITE_OTHER): Payer: Medicaid Other | Admitting: Obstetrics & Gynecology

## 2010-07-08 NOTE — Group Therapy Note (Signed)
Shannon Bowers, Shannon Bowers               ACCOUNT NO.:  [id number]  MEDICAL RECORD NO.:  [id number]           PATIENT TYPE:  A  LOCATION:  [facility]                   FACILITY:  [facility]  PHYSICIAN:  Allie Bossier, MD        DATE OF BIRTH:  01-05-1971  DATE OF SERVICE:  07/07/2010                                 CLINIC NOTE  [name] is a 40 year old married [place] gravida 2, now para 2 who delivered via C-section for CPD approximately 6 weeks ago.  The baby is doing well.  She is doing well and has no complaints.  She would like to have an IUD placed as soon as possible.  She says she has not had intercourse since delivery.  She denies any bowel or bladder dysfunction.  She denies baby blue symptoms and she is currently breast- feeding her child.  Of note, her pregnancy was complicated by gestational diabetes that was controlled with the diet.  So, I have explained to her that she will need a 2-hour Glucola as soon as possible (at the same time as her IUD preferably).  Her physical exam is entirely within normal limits.  We will schedule her IUD and 2-hour Glucola ASAP.     Allie Bossier, MD    MCD/MEDQ  D:  07/07/2010  T:  07/08/2010  Job:  [id number]

## 2010-07-13 NOTE — Discharge Summary (Signed)
NAMEDAMIA, BOBROWSKI               ACCOUNT NO.:  [id number]   MEDICAL RECORD NO.:  [id number]          PATIENT TYPE:  INP   LOCATION:  9320                          [facility]   PHYSICIAN:  Ginger Carne, MD  DATE OF BIRTH:  October 19, 1970   DATE OF ADMISSION:  09/01/2006  DATE OF DISCHARGE:  09/04/2006                               DISCHARGE SUMMARY   ADMISSION DIAGNOSIS:  Preterm premature rupture of membranes, twin  gestation -- breech.   DISCHARGE DIAGNOSIS:  Preterm premature rupture of membranes, twin  gestation -- breech.   SERVICE:  OB teaching service.   ATTENDING PHYSICIANS:  Ginger Carne, MD and Eustaquio Boyden, MD   HISTORY:  The patient is a 40 year old G1 P0-2-0-2 who presents at [redacted]  weeks gestation with preterm premature rupture of membranes and breech  twin gestation.  The patient admitted for primary low cervical  transverse cesarean section on September 01, 2006.  She had a low cervical  transverse cesarean section on September 01, 2006 at 4 a.m. by Dr. Mia Creek  under spinal anesthesia.  Estimated blood loss 1500 mL. Complications --  no immediate complications.  Surgeon -- Dr. Mia Creek under spinal  anesthesia.   Twin A with Apgars of 7 at one minute and 9 at five minutes.  This was a  female twin who weighed 4 pounds 3.7 ounces and was taking 16.5 inches  long.  Twin A was delivered at 0335, breech presentation, and placenta  was sent to pathology.  Twin B was a little boy delivered at 0337 Apgars  of 7 at one minute and 9 at five minutes.  Twin B weighed 5 lbs. 5 oz.  and was 19-1/4 inches long.  Delivery presentation was breech and  placenta was sent to pathology.   Patient had uterine atony with some postop hemorrhage, estimated blood  loss was 1500 mL, so the patient was transfused two units of panic red  blood cells at the PACU.  Preop hemoglobin was 10.1, postop hemoglobin  9.2, hematocrit 27.3 on September 04, 2006.  Mother's information was blood  type A  positive, antibody negative, rubella immune, HB surface antigen  negative, RPR nonreactive, chlamydia and gonorrhea negative.  HIV  nonreactive.   The rest of the patient's hospital course was unremarkable.  The patient  plans to breast-and- bottle feed, still unsure as to birth control.  Mom  had some gestational diabetes during pregnancy.  Since delivery that has  resolved.  Mom was GBS positive and given Unasyn.   DISCHARGE CONDITION:  Mom is in good condition.  Babies twin A and twin  B. will remain in NICU for a few more weeks.  Previous EDC was October 15, 2006.  Mom to be discharged to home.   DISCHARGE MEDICATIONS:  1. Motrin 600 mg p.o. q.6 h. for pain and cramps.  2. Percocet 5/325 mg p.o. 1-2 tablets q.4-6 h. for pain.  3. Colace 100 mg by mouth twice daily for constipation.  4. Prenatal vitamins one tablet every day by mouth.   DISCHARGE INSTRUCTIONS:  1. Diet at  home regular.  2. Activity -- no heavy lifting for six weeks.  3. Routine follow up in six weeks with [facility]      Department, [facility].      Eustaquio Boyden, MD      Ginger Carne, MD  Electronically Signed    JG/MEDQ  D:  09/04/2006  T:  09/04/2006  Job:  [id number]

## 2010-07-13 NOTE — Discharge Summary (Signed)
  [name], Shannon Bowers               ACCOUNT NO.:  [id number]  MEDICAL RECORD NO.:  [id number]           PATIENT TYPE:  I  LOCATION:  9101                          [facility]  PHYSICIAN:  Catalina Antigua, MD     DATE OF BIRTH:  [birth year]  DATE OF ADMISSION:  06/09/2010 DATE OF DISCHARGE:  06/12/2010                              DISCHARGE SUMMARY   Patient presented to MAU with PROM at 39 wks. Patient with PNC complicated by GDM A1 and h/o previous c-section secondary to twins. Patient desired TOLAC. IOL was initiated with cervical foley and later augmented with pitocin. In view of no cervical  change, patient was conselled and agreed to repeat low transverse c-section secondary to CPD. A viable female infan with APGARS 8 and 9 and weight of 7lb 8oz was delivered.   Throughout her postoperative period, the patient's vital signs remained stable, she was able to void and tolerated a regular diet. H and H of 9.7/29.6 on June 10, 2010. On POD#3 patient was found stable for discharge with plans to follow-up in [facility]  clinic in 6 weeks.  Discharge instructions were provided to the patient. Patient was advised to abstain from intercourse  for 6 weeks. No heavy lifiting for 6 weeks and to return to MAU if develop fever, chills, or abnormal drainage from incision.A prescription fro percocet  was given.      ______________________________ Edd Arbour, MD   ______________________________ Catalina Antigua, MD    JO/MEDQ  D:  06/12/2010  T:  06/12/2010  Job:  [id number]  Electronically Signed by Edd Arbour MD on 07/07/2010 12:38:41 PM Electronically Signed by Catalina Antigua  on 07/13/2010 07:21:48 PM

## 2010-07-13 NOTE — Op Note (Signed)
Shannon Bowers, ARMAN               ACCOUNT NO.:  [id number]   MEDICAL RECORD NO.:  [id number]          PATIENT TYPE:  INP   LOCATION:  9320                          [facility]   PHYSICIAN:  Ginger Carne, MD  DATE OF BIRTH:  03-15-1970   DATE OF PROCEDURE:  09/01/2006  DATE OF DISCHARGE:                               OPERATIVE REPORT   PREOPERATIVE DIAGNOSIS:  35-5/[redacted] weeks gestation, twin pregnancy with  preterm premature rupture of membranes, breech breech presentation.   POSTOPERATIVE DIAGNOSIS:  35-5/[redacted] weeks gestation, twin pregnancy with  preterm premature rupture of membranes, breech breech presentation.  Preterm viable delivery of female and female infants.   PROCEDURE:  Primary low transverse cesarean section.   SURGEON:  Ginger Carne, M.D.   ASSISTANT:  None.   COMPLICATIONS:  None immediate.   ESTIMATED BLOOD LOSS:  1500 mL due to uterine atony.   ANESTHESIA:  Spinal.   OPERATIVE FINDINGS:  The baby A was frank breech presentation female  with rupture of the amniotic sac.  Baby B was in a separate sac intact  in a breech presentation female infant.  No gross abnormalities of either  baby and both babies cried spontaneously at delivery.  Amniotic fluid  was clear for both infants.  Uterus, tubes and ovaries showed normal  decidual changes of pregnancy. Placenta was complete three-vessels for  both cords with central insertion noted.   OPERATIVE PROCEDURE:  The patient prepped and draped in usual fashion  and placed in the left lateral supine position.  Betadine solution used  for antiseptic and the patient was catheterized prior to procedure.  After adequate spinal analgesia a Pfannenstiel incision was made and the  abdomen opened.  Bladder flap developed and lower uterine segment  incised transversely.  Baby A delivered as a partial assisted breech  delivery and baby B delivered similarly.  Both infants were bulb  suctioned, cords clamped, cut and infants  given to the pediatric staff.  Cord bloods obtained separately from both cords.  Placenta removed  manually.  Uterus inspected.  Closure uterine musculature in one layers  of 0 Vicryl running interlocking suture.  Bleeding points hemostatically  checked.  Blood clots removed.  Closure of the fascia in one layer with  0 Vicryl running and skin staples for the skin. Instrument and sponge  count  were correct.  The patient tolerated the procedure well, returned to the  post anesthesia recovery room in excellent condition.  Due to excessive  blood loss due to atony 2 units of packed red blood cells will be  transfused in the PACU.      Ginger Carne, MD  Electronically Signed     SHB/MEDQ  D:  09/01/2006  T:  09/01/2006  Job:  [id number]

## 2010-07-15 ENCOUNTER — Other Ambulatory Visit: Payer: Self-pay | Admitting: Family Medicine

## 2010-07-15 ENCOUNTER — Ambulatory Visit (INDEPENDENT_AMBULATORY_CARE_PROVIDER_SITE_OTHER): Payer: Medicaid Other | Admitting: Family Medicine

## 2010-07-15 DIAGNOSIS — Z30430 Encounter for insertion of intrauterine contraceptive device: Secondary | ICD-10-CM

## 2010-07-16 NOTE — Group Therapy Note (Signed)
[name], [name]               ACCOUNT NO.:  [id number]  MEDICAL RECORD NO.:  [id number]           PATIENT TYPE:  A  [facility]ATION:  [facility]                   [facility]  PHYSICIAN:  Lucina Mellow, DO   DATE OF BIRTH:  15-Jul-1970  DATE OF SERVICE:  07/15/2010                                 CLINIC NOTE  The patient presents for IUD insertion.  HISTORY OF PRESENT ILLNESS:  Shannon Bowers is a 40 year old married [place] gravida 2, now para 2, who delivered by C-section approximately 6 weeks ago.  She had a post partum visit on May 9 with Dr. Marice Potter and she also needs a 2-hour Glucola scheduled.  She is going to come back in for checking tomorrow for that for her history of gestational diabetes.  The patient is again counseled on what IUD is and the procedure.  She states that she had a 10-year IUD in the past.  She is familiar with the process and the procedure.  She is currently breastfeeding, this is going well.  She does have a few questions about breast milk storage  PROCEDURE NOTE:  The patient has signed a consent with the use of an interpreter and it is on the chart.  A time-out was performed as the patient is identified correctly.  Urine pregnancy test is negative.  The patient had a gonorrhea and Chlamydia test which were negative the last part of March of this year on March 19.  The patient's cervix is easily identified and is nonparous.  There is blood in the vaginal vault as the patient is on her cycle.  The uterus is sound to approximately 7.5 cm to 8 cm very easily with minimal discomfort.  This has matched up with the IUD length and the Mirena device.  The Mirena device was inserted and the retractor was placed and upon removal of the IUD device, the Mirena removed itself as there seem to be a knot in the string inside of the pipette.  This Mirena was discarded and a second Mirena was obtained and was matched to the 8 cm depth of the uterus.  The retractor was  pulled, the Mirena fired and came out very easily.  The strings were trimmed and a speculum was removed.  Minimal discomfort was noted at this time.  The patient is counseled on checking for the strings after her cycle has completed and to return to the clinic or to report to the health department to have her strings checked in about 6 weeks.  The patient's questions about breast milk stores are answered and the patient is discharged from the clinic.          ______________________________ Lucina Mellow, DO    SH/MEDQ  D:  07/15/2010  T:  07/16/2010  Job:  [id number]

## 2010-07-19 ENCOUNTER — Other Ambulatory Visit: Payer: Medicaid Other

## 2010-07-19 DIAGNOSIS — Z0189 Encounter for other specified special examinations: Secondary | ICD-10-CM

## 2010-11-18 LAB — DIFFERENTIAL
Basophils Absolute: 0
Basophils Relative: 1
Eosinophils Absolute: 0
Eosinophils Relative: 1
Lymphocytes Relative: 30
Lymphs Abs: 1.3
Monocytes Absolute: 0.3
Monocytes Absolute: 1
Monocytes Relative: 10
Neutrophils Relative %: 77

## 2010-11-18 LAB — PREGNANCY, URINE: Preg Test, Ur: NEGATIVE

## 2010-11-18 LAB — I-STAT 8, (EC8 V) (CONVERTED LAB)
Acid-Base Excess: 1
Chloride: 103
HCT: 39
Operator id: 133351
Potassium: 3.5
pH, Ven: 7.508 — ABNORMAL HIGH

## 2010-11-18 LAB — URINALYSIS, ROUTINE W REFLEX MICROSCOPIC
Bilirubin Urine: NEGATIVE
Specific Gravity, Urine: 1.01
Urobilinogen, UA: 1

## 2010-11-18 LAB — COMPREHENSIVE METABOLIC PANEL
AST: 68 — ABNORMAL HIGH
Albumin: 2.7 — ABNORMAL LOW
Alkaline Phosphatase: 110
Chloride: 102
GFR calc Af Amer: >60
Potassium: 3.7
Sodium: 141
Total Bilirubin: 0.2 — ABNORMAL LOW

## 2010-11-18 LAB — CBC
Hemoglobin: 12.3
MCV: 86.9
Platelets: 408 — ABNORMAL HIGH
RBC: 4.13
WBC: 10.2
WBC: 4.8

## 2010-11-18 LAB — POCT I-STAT CREATININE: Creatinine, Ser: 0.9

## 2010-12-14 ENCOUNTER — Encounter (HOSPITAL_COMMUNITY): Payer: Self-pay | Admitting: *Deleted

## 2010-12-14 LAB — CBC
HCT: 27.3 — ABNORMAL LOW
HCT: 28.6 — ABNORMAL LOW
HCT: 35.9 — ABNORMAL LOW
Hemoglobin: 10.1 — ABNORMAL LOW
Hemoglobin: 12.2
Hemoglobin: 9.6 — ABNORMAL LOW
MCHC: 33.5
MCHC: 33.7
MCV: 88.5
MCV: 88.8
Platelets: 210
RBC: 3.22 — ABNORMAL LOW
RBC: 3.34 — ABNORMAL LOW
RBC: 4.05
RDW: 15 — ABNORMAL HIGH
WBC: 13.2 — ABNORMAL HIGH

## 2010-12-14 LAB — CROSSMATCH: Antibody Screen: NEGATIVE

## 2010-12-14 LAB — ABO/RH: ABO/RH(D): A POS

## 2010-12-15 LAB — POCT URINALYSIS DIP (DEVICE)
Operator id: 159681
Operator id: 200901
Protein, ur: NEGATIVE
Protein, ur: NEGATIVE
Specific Gravity, Urine: 1.01
Specific Gravity, Urine: 1.015
Urobilinogen, UA: 0.2
Urobilinogen, UA: 0.2
pH: 5.5
pH: 6

## 2010-12-16 LAB — POCT URINALYSIS DIP (DEVICE)
Ketones, ur: 15 — AB
Ketones, ur: NEGATIVE
Operator id: 159681
Operator id: 194561
Protein, ur: NEGATIVE
Specific Gravity, Urine: 1.015
Specific Gravity, Urine: 1.02
Urobilinogen, UA: 0.2
Urobilinogen, UA: 0.2
pH: 6
pH: 6

## 2013-06-13 ENCOUNTER — Ambulatory Visit: Payer: 59 | Admitting: Family Medicine

## 2013-06-13 VITALS — BP 128/82 | HR 108 | Temp 100.3°F | Resp 17 | Ht 68.0 in | Wt 215.0 lb

## 2013-06-13 DIAGNOSIS — R509 Fever, unspecified: Secondary | ICD-10-CM

## 2013-06-13 DIAGNOSIS — J029 Acute pharyngitis, unspecified: Secondary | ICD-10-CM

## 2013-06-13 DIAGNOSIS — J020 Streptococcal pharyngitis: Secondary | ICD-10-CM

## 2013-06-13 LAB — POCT CBC
Granulocyte percent: 82.9 %G — AB (ref 37–80)
HEMATOCRIT: 41.2 % (ref 37.7–47.9)
Hemoglobin: 13.1 g/dL (ref 12.2–16.2)
LYMPH, POC: 1.8 (ref 0.6–3.4)
MCH, POC: 28.9 pg (ref 27–31.2)
MCHC: 31.8 g/dL (ref 31.8–35.4)
MCV: 90.7 fL (ref 80–97)
MID (cbc): 0.8 (ref 0–0.9)
MPV: 9.3 fL (ref 0–99.8)
POC Granulocyte: 12.5 — AB (ref 2–6.9)
POC LYMPH %: 11.8 % (ref 10–50)
POC MID %: 5.3 %M (ref 0–12)
Platelet Count, POC: 377 10*3/uL (ref 142–424)
RBC: 4.54 M/uL (ref 4.04–5.48)
RDW, POC: 12.8 %
WBC: 15.1 10*3/uL — AB (ref 4.6–10.2)

## 2013-06-13 LAB — POCT RAPID STREP A (OFFICE): Rapid Strep A Screen: POSITIVE — AB

## 2013-06-13 MED ORDER — CEFTRIAXONE SODIUM 1 G IJ SOLR
1.0000 g | Freq: Once | INTRAMUSCULAR | Status: AC
  Administered 2013-06-13: 1 g via INTRAMUSCULAR

## 2013-06-13 MED ORDER — HYDROCODONE-ACETAMINOPHEN 7.5-325 MG/15ML PO SOLN: 10.0000 mL | Freq: Four times a day (QID) | ORAL | Status: DC | PRN

## 2013-06-13 MED ORDER — PENICILLIN V POTASSIUM 500 MG PO TABS: 500.0000 mg | ORAL_TABLET | Freq: Two times a day (BID) | ORAL | Status: DC

## 2013-06-13 NOTE — Patient Instructions (Signed)
You have strep throat- this is an infection of the throat that is cured by antibiotics.  Use the antibiotic pills, and use the pain medication as needed.  If you are not feeling better in the next 24 hours please come back in to be seen.  Use the pain medication to control your pain so you can swallow.  If you are not able to swallow liquids please seek care.

## 2013-06-13 NOTE — Progress Notes (Signed)
[facility] [address], [place] [id number] [phone]  Date:  06/13/2013   Name:  Shannon Bowers   DOB:  11/22/1970   MRN:  [id number]  PCP:  No primary provider on file.    Chief Complaint: Fever, Sore Throat, Headache, Tinnitus and Generalized Body Aches   History of Present Illness:  Shannon BouillonRahma Bowers is a 43 y.o. very pleasant female patient who presents with the following:  Here today with illness- she is a new patient.   She is here today with ST and headache that started yesterday She does not have nausea or abdominal pain but cannot swallow- she is having to spit out her saliva due to pain No vomiting She has a high fever and headache.  LMP started 2 days ago- she has an IUD as well.  Her menses are irregular due to IUD She is otherwise generally healthy Did take some advil at 6am today, no tylenol yet today  There are no active problems to display for this patient.   History reviewed. No pertinent past medical history.  Past Surgical History  Procedure Laterality Date  . Cesarean section      History  Substance Use Topics  . Smoking status: Never Smoker   . Smokeless tobacco: Not on file  . Alcohol Use: No    History reviewed. No pertinent family history.  No Known Allergies  Medication list has been reviewed and updated.  No current outpatient prescriptions on file prior to visit.   No current facility-administered medications on file prior to visit.    Review of Systems:  As per HPI- otherwise negative.   Physical Examination: Filed Vitals:   06/13/13 1140  BP: 128/82  Pulse: 122  Temp: 103 F (39.4 C)  Resp: 17   Filed Vitals:   06/13/13 1140  Height: 5\' 8"  (1.727 m)  Weight: 215 lb (97.523 kg)   Body mass index is 32.7 kg/(m^2). Ideal Body Weight: Weight in (lb) to have BMI = 25: 164.1  GEN: WDWN, NAD, Non-toxic, A & O x 3.  Here today with her husband and child HEENT: Atraumatic, Normocephalic. Neck  supple. No masses, tender cervical LAD bilaterally.  Bilateral TM wnl, oropharynx inflamed with exudate bilaterally but no evidence of abscess.  Uvula is midline.  PEERL,EOMI.   Ears and Nose: No external deformity. CV: RRR, No M/G/R. No JVD. No thrill. No extra heart sounds. PULM: CTA B, no wheezes, crackles, rhonchi. No retractions. No resp. distress. No accessory muscle use. ABD: S, NT, ND EXTR: No c/c/e NEURO Normal gait.  PSYCH: Normally interactive. Conversant. Not depressed or anxious appearing.  Calm demeanor.  Is able to speak and swallow medicine.    Given 1,000 mg of tylenol once Given 400 mg of ibuprofen once  Results for orders placed in visit on 06/13/13  POCT CBC      Result Value Ref Range   WBC 15.1 (*) 4.6 - 10.2 K/uL   Lymph, poc 1.8  0.6 - 3.4   POC LYMPH PERCENT 11.8  10 - 50 %L   MID (cbc) 0.8  0 - 0.9   POC MID % 5.3  0 - 12 %M   POC Granulocyte 12.5 (*) 2 - 6.9   Granulocyte percent 82.9 (*) 37 - 80 %G   RBC 4.54  4.04 - 5.48 M/uL   Hemoglobin 13.1  12.2 - 16.2 g/dL   HCT, POC 28.441.2  13.237.7 - 47.9 %   MCV  90.7  80 - 97 fL   MCH, POC 28.9  27 - 31.2 pg   MCHC 31.8  31.8 - 35.4 g/dL   RDW, POC 16.112.8     Platelet Count, POC 377  142 - 424 K/uL   MPV 9.3  0 - 99.8 fL  POCT RAPID STREP A (OFFICE)      Result Value Ref Range   Rapid Strep A Screen Positive (*) Negative    Assessment and Plan: Streptococcal sore throat - Plan: penicillin v potassium (VEETID) 500 MG tablet  Acute pharyngitis - Plan: POCT CBC, POCT rapid strep A, cefTRIAXone (ROCEPHIN) injection 1 g, HYDROcodone-acetaminophen (HYCET) 7.5-325 mg/15 ml solution  Strep pharyngitis.  Treat with a gram of rocephin and penicillin VK 500 BID for 10 days.  Given rx for lortab elixer to use as needed for pain.  Encouraged cold, soft foods and drinks such as a milkshake, ice cream, popsicles. Counseled that if she is not able to control her pain well enough to start drinking liquids she needs to come back  in and she agreed.    Signed Abbe AmsterdamJessica Jacqeline Broers, MD

## 2013-08-19 ENCOUNTER — Encounter: Payer: Self-pay | Admitting: Family Medicine

## 2013-08-19 ENCOUNTER — Ambulatory Visit (INDEPENDENT_AMBULATORY_CARE_PROVIDER_SITE_OTHER): Payer: 59 | Admitting: Family Medicine

## 2013-08-19 VITALS — BP 136/92 | HR 96 | Temp 98.9°F | Ht 68.0 in | Wt 211.0 lb

## 2013-08-19 DIAGNOSIS — Z30432 Encounter for removal of intrauterine contraceptive device: Secondary | ICD-10-CM

## 2013-08-19 MED ORDER — NORETHIN ACE-ETH ESTRAD-FE 1-20 MG-MCG(24) PO TABS: 1.0000 | ORAL_TABLET | Freq: Every day | ORAL | Status: DC

## 2013-08-19 MED ORDER — PRENATAL VITAMINS 0.8 MG PO TABS: 1.0000 | ORAL_TABLET | Freq: Every day | ORAL | Status: DC

## 2013-08-19 NOTE — Progress Notes (Signed)
Patient ID: Shannon Bowers, female   DOB: 06/30/1970, 43 y.o.   MRN: [id number] S: 43 yo here for IUD removal.  - her husband and she want to have another child - wants IUD out. Has been in for 3.5 years.  - no complaints.   Would like ocps for 2 months prior to trying to conceive.   No fevers, chills, abd pain.   Tawana Scale; Filed Vitals:   08/19/13 1246 08/19/13 1248  BP: 141/98 136/92  Pulse: 96   Temp: 98.9 F (37.2 C)   TempSrc: Oral   Height: 5\' 8"  (1.727 m)   Weight: 211 lb (95.709 kg)    GEN: NAD GU: NEFG, normal vaginal vault, cervix well visualized but no visible IUD strings. Normal uterus.    A/P  GYNECOLOGY CLINIC PROCEDURE NOTE  IUD Removal  Patient was in the dorsal lithotomy position, normal external genitalia was noted.  A speculum was placed in the patient's vagina, normal discharge was noted, no lesions. The multiparous cervix was visualized, no lesions, no abnormal discharge.  The strings of the IUD were not visible.  A cytology brush was first introduced to attempt to sweep them out but was unsuccessful.  A Kelly forceps was introduced into the endometrial cavity and the IUD was grasped and removed in its entirety.  Patient tolerated the procedure well.    OCPs given for 3 months  Start PNV Discussed high risk status due to AMA if becomes pregnant and need to establish care early.    BECK, Redmond BasemanKELI L, MD

## 2013-08-19 NOTE — Patient Instructions (Signed)
Ethinyl Estradiol; Norethindrone Acetate; Ferrous fumarate tablets (contraception) What is this medicine? ETHINYL ESTRADIOL; NORETHINDRONE ACETATE; FERROUS FUMARATE (ETH in il es tra DYE ole; nor eth IN drone AS e tate; FER us FUE ma rate) is an oral contraceptive. The products combine two types of female hormones, an estrogen and a progestin. They are used to prevent ovulation and pregnancy. Some products are also used to treat acne in females. This medicine may be used for other purposes; ask your health care provider or pharmacist if you have questions. COMMON BRAND NAME(S): Estrostep Fe, Gildess Fe 1.5/30, Gildess Fe 1/20, Junel Fe 1.5/30, Junel Fe 1/20, Larin Fe, Lo Loestrin Fe, Loestrin 24 Fe, Loestrin FE 1.5/30, Loestrin FE 1/20, Lomedia 24 Fe, Microgestin Fe 1.5/30, Microgestin Fe 1/20, Tarina Fe 1/20, Tilia Fe, Tri-Legest Fe What should I tell my health care provider before I take this medicine? They need to know if you have any of these conditions: -abnormal vaginal bleeding -blood vessel disease -breast, cervical, endometrial, ovarian, liver, or uterine cancer -diabetes -gallbladder disease -heart disease or recent heart attack -high blood pressure -high cholesterol -history of blood clots -kidney disease -liver disease -migraine headaches -smoke tobacco -stroke -systemic lupus erythematosus (SLE) -an unusual or allergic reaction to estrogens, progestins, other medicines, foods, dyes, or preservatives -pregnant or trying to get pregnant -breast-feeding How should I use this medicine? Take this medicine by mouth. To reduce nausea, this medicine may be taken with food. Follow the directions on the prescription label. Take this medicine at the same time each day and in the order directed on the package. Do not take your medicine more often than directed. A patient package insert for the product will be given with each prescription and refill. Read this sheet carefully each time.  The sheet may change frequently. Contact your pediatrician regarding the use of this medicine in children. Special care may be needed. This medicine has been used in female children who have started having menstrual periods. Overdosage: If you think you've taken too much of this medicine contact a poison control center or emergency room at once. Overdosage: If you think you have taken too much of this medicine contact a poison control center or emergency room at once. NOTE: This medicine is only for you. Do not share this medicine with others. What if I miss a dose? If you miss a dose, refer to the patient information sheet you received with your medicine for direction. If you miss more than one pill, this medicine may not be as effective and you may need to use another form of birth control. What may interact with this medicine? -acetaminophen -antibiotics or medicines for infections, especially rifampin, rifabutin, rifapentine, and griseofulvin, and possibly penicillins or tetracyclines -aprepitant -ascorbic acid (vitamin C) -atorvastatin -barbiturate medicines, such as phenobarbital -bosentan -carbamazepine -caffeine -clofibrate -cyclosporine -dantrolene -doxercalciferol -felbamate -grapefruit juice -hydrocortisone -medicines for anxiety or sleeping problems, such as diazepam or temazepam -medicines for diabetes, including pioglitazone -mineral oil -modafinil -mycophenolate -nefazodone -oxcarbazepine -phenytoin -prednisolone -ritonavir or other medicines for HIV infection or AIDS -rosuvastatin -selegiline -soy isoflavones supplements -St. John's wort -tamoxifen or raloxifene -theophylline -thyroid hormones -topiramate -warfarin This list may not describe all possible interactions. Give your health care provider a list of all the medicines, herbs, non-prescription drugs, or dietary supplements you use. Also tell them if you smoke, drink alcohol, or use illegal drugs. Some  items may interact with your medicine. What should I watch for while using this medicine? Visit your doctor or health   care professional for regular checks on your progress. You will need a regular breast and pelvic exam and Pap smear while on this medicine. Use an additional method of contraception during the first cycle that you take these tablets. If you have any reason to think you are pregnant, stop taking this medicine right away and contact your doctor or health care professional. If you are taking this medicine for hormone related problems, it may take several cycles of use to see improvement in your condition. Smoking increases the risk of getting a blood clot or having a stroke while you are taking birth control pills, especially if you are more than 42 years old. You are strongly advised not to smoke. This medicine can make your body retain fluid, making your fingers, hands, or ankles swell. Your blood pressure can go up. Contact your doctor or health care professional if you feel you are retaining fluid. This medicine can make you more sensitive to the sun. Keep out of the sun. If you cannot avoid being in the sun, wear protective clothing and use sunscreen. Do not use sun lamps or tanning beds/booths. If you wear contact lenses and notice visual changes, or if the lenses begin to feel uncomfortable, consult your eye care specialist. In some women, tenderness, swelling, or minor bleeding of the gums may occur. Notify your dentist if this happens. Brushing and flossing your teeth regularly may help limit this. See your dentist regularly and inform your dentist of the medicines you are taking. If you are going to have elective surgery, you may need to stop taking this medicine before the surgery. Consult your health care professional for advice. This medicine does not protect you against HIV infection (AIDS) or any other sexually transmitted diseases. What side effects may I notice from  receiving this medicine? Side effects that you should report to your doctor or health care professional as soon as possible: -allergic reactions like skin rash, itching or hives, swelling of the face, lips, or tongue -breast tissue changes or discharge -changes in vaginal bleeding during your period or between your periods -changes in vision -chest pain -confusion -coughing up blood -dizziness -feeling faint or lightheaded -headaches or migraines -leg, arm or groin pain -loss of balance or coordination -severe or sudden headaches -stomach pain (severe) -sudden shortness of breath -sudden numbness or weakness of the face, arm or leg -symptoms of vaginal infection like itching, irritation or unusual discharge -tenderness in the upper abdomen -trouble speaking or understanding -vomiting -yellowing of the eyes or skin Side effects that usually do not require medical attention (Report these to your doctor or health care professional if they continue or are bothersome.): -breakthrough bleeding and spotting that continues beyond the 3 initial cycles of pills -breast tenderness -mood changes, anxiety, depression, frustration, anger, or emotional outbursts -increased sensitivity to sun or ultraviolet light -nausea -skin rash, acne, or brown spots on the skin -weight gain (slight) This list may not describe all possible side effects. Call your doctor for medical advice about side effects. You may report side effects to FDA at 1-800-FDA-1088. Where should I keep my medicine? Keep out of the reach of children. Store at room temperature between 15 and 30 degrees C (59 and 86 degrees F). Throw away any unused medicine after the expiration date. NOTE: This sheet is a summary. It may not cover all possible information. If you have questions about this medicine, talk to your doctor, pharmacist, or health care provider.  2015, [employer]. (2012-06-20   15:05:22)  

## 2013-10-22 ENCOUNTER — Encounter: Payer: Self-pay | Admitting: General Practice

## 2013-12-30 ENCOUNTER — Encounter: Payer: Self-pay | Admitting: Family Medicine

## 2014-07-17 ENCOUNTER — Ambulatory Visit (INDEPENDENT_AMBULATORY_CARE_PROVIDER_SITE_OTHER): Payer: 59 | Admitting: Family Medicine

## 2014-07-17 VITALS — BP 118/70 | HR 96 | Temp 98.2°F | Resp 18 | Ht 69.0 in | Wt 222.0 lb

## 2014-07-17 DIAGNOSIS — Z124 Encounter for screening for malignant neoplasm of cervix: Secondary | ICD-10-CM | POA: Diagnosis not present

## 2014-07-17 DIAGNOSIS — Z130 Encounter for screening for diseases of the blood and blood-forming organs and certain disorders involving the immune mechanism: Secondary | ICD-10-CM | POA: Diagnosis not present

## 2014-07-17 DIAGNOSIS — Z131 Encounter for screening for diabetes mellitus: Secondary | ICD-10-CM | POA: Diagnosis not present

## 2014-07-17 DIAGNOSIS — Z136 Encounter for screening for cardiovascular disorders: Secondary | ICD-10-CM

## 2014-07-17 DIAGNOSIS — Z23 Encounter for immunization: Secondary | ICD-10-CM | POA: Diagnosis not present

## 2014-07-17 DIAGNOSIS — B356 Tinea cruris: Secondary | ICD-10-CM | POA: Diagnosis not present

## 2014-07-17 DIAGNOSIS — R351 Nocturia: Secondary | ICD-10-CM

## 2014-07-17 DIAGNOSIS — R10819 Abdominal tenderness, unspecified site: Secondary | ICD-10-CM

## 2014-07-17 DIAGNOSIS — Z1239 Encounter for other screening for malignant neoplasm of breast: Secondary | ICD-10-CM

## 2014-07-17 DIAGNOSIS — E559 Vitamin D deficiency, unspecified: Secondary | ICD-10-CM | POA: Diagnosis not present

## 2014-07-17 DIAGNOSIS — B3731 Acute candidiasis of vulva and vagina: Secondary | ICD-10-CM

## 2014-07-17 DIAGNOSIS — Z0000 Encounter for general adult medical examination without abnormal findings: Secondary | ICD-10-CM

## 2014-07-17 DIAGNOSIS — L290 Pruritus ani: Secondary | ICD-10-CM | POA: Diagnosis not present

## 2014-07-17 DIAGNOSIS — Z1383 Encounter for screening for respiratory disorder NEC: Secondary | ICD-10-CM | POA: Diagnosis not present

## 2014-07-17 DIAGNOSIS — R350 Frequency of micturition: Secondary | ICD-10-CM | POA: Diagnosis not present

## 2014-07-17 DIAGNOSIS — L293 Anogenital pruritus, unspecified: Secondary | ICD-10-CM

## 2014-07-17 DIAGNOSIS — Z1389 Encounter for screening for other disorder: Secondary | ICD-10-CM

## 2014-07-17 DIAGNOSIS — B373 Candidiasis of vulva and vagina: Secondary | ICD-10-CM

## 2014-07-17 DIAGNOSIS — Z1329 Encounter for screening for other suspected endocrine disorder: Secondary | ICD-10-CM

## 2014-07-17 LAB — POCT UA - MICROSCOPIC ONLY
CASTS, UR, LPF, POC: NEGATIVE
Crystals, Ur, HPF, POC: NEGATIVE
Mucus, UA: NEGATIVE
YEAST UA: NEGATIVE

## 2014-07-17 LAB — CBC
HCT: 38.2 % (ref 36.0–46.0)
HEMOGLOBIN: 12.9 g/dL (ref 12.0–15.0)
MCH: 29.4 pg (ref 26.0–34.0)
MCHC: 33.8 g/dL (ref 30.0–36.0)
MCV: 87 fL (ref 78.0–100.0)
MPV: 10.4 fL (ref 8.6–12.4)
Platelets: 412 10*3/uL — ABNORMAL HIGH (ref 150–400)
RBC: 4.39 MIL/uL (ref 3.87–5.11)
RDW: 13.5 % (ref 11.5–15.5)
WBC: 6.3 10*3/uL (ref 4.0–10.5)

## 2014-07-17 LAB — COMPREHENSIVE METABOLIC PANEL
ALT: 18 U/L (ref 0–35)
AST: 16 U/L (ref 0–37)
Albumin: 3.8 g/dL (ref 3.5–5.2)
Alkaline Phosphatase: 65 U/L (ref 39–117)
BUN: 7 mg/dL (ref 6–23)
CALCIUM: 9.1 mg/dL (ref 8.4–10.5)
CHLORIDE: 101 meq/L (ref 96–112)
CO2: 28 meq/L (ref 19–32)
CREATININE: 0.54 mg/dL (ref 0.50–1.10)
Glucose, Bld: 80 mg/dL (ref 70–99)
Potassium: 4 mEq/L (ref 3.5–5.3)
Sodium: 138 mEq/L (ref 135–145)
Total Bilirubin: 0.3 mg/dL (ref 0.2–1.2)
Total Protein: 7.6 g/dL (ref 6.0–8.3)

## 2014-07-17 LAB — GLUCOSE, POCT (MANUAL RESULT ENTRY): POC Glucose: 78 mg/dl (ref 70–99)

## 2014-07-17 LAB — POCT URINALYSIS DIPSTICK
Bilirubin, UA: NEGATIVE
Blood, UA: NEGATIVE
GLUCOSE UA: NEGATIVE
Ketones, UA: NEGATIVE
Nitrite, UA: NEGATIVE
PH UA: 7
PROTEIN UA: NEGATIVE
Spec Grav, UA: 1.02
Urobilinogen, UA: 0.2

## 2014-07-17 LAB — POCT SKIN KOH: Skin KOH, POC: NEGATIVE

## 2014-07-17 LAB — POCT URINE PREGNANCY: PREG TEST UR: NEGATIVE

## 2014-07-17 LAB — LIPID PANEL
Cholesterol: 255 mg/dL — ABNORMAL HIGH (ref 0–200)
HDL: 70 mg/dL (ref >=46)
LDL Cholesterol: 161 mg/dL — ABNORMAL HIGH (ref 0–99)
Total CHOL/HDL Ratio: 3.6 Ratio
Triglycerides: 120 mg/dL (ref <150)
VLDL: 24 mg/dL (ref 0–40)

## 2014-07-17 LAB — TSH: TSH: 1.293 u[IU]/mL (ref 0.350–4.500)

## 2014-07-17 LAB — POCT WET PREP WITH KOH
KOH Prep POC: POSITIVE
TRICHOMONAS UA: NEGATIVE
Yeast Wet Prep HPF POC: POSITIVE

## 2014-07-17 LAB — POCT GLYCOSYLATED HEMOGLOBIN (HGB A1C): HEMOGLOBIN A1C: 5.4

## 2014-07-17 MED ORDER — PRENATAL VITAMINS 0.8 MG PO TABS: 1.0000 | ORAL_TABLET | Freq: Every day | ORAL | Status: DC

## 2014-07-17 MED ORDER — CLOTRIMAZOLE 1 % EX CREA
1.0000 | TOPICAL_CREAM | Freq: Two times a day (BID) | CUTANEOUS | Status: DC
Start: 2014-07-17 — End: 2015-10-16

## 2014-07-17 MED ORDER — FLUCONAZOLE 150 MG PO TABS: 150.0000 mg | ORAL_TABLET | ORAL | Status: DC

## 2014-07-17 NOTE — Progress Notes (Signed)
Subjective:    Patient ID: Shannon Bowers, female    DOB: 02/14/1971, 44 y.o.   MRN: [id number] Chief Complaint  Patient presents with  . Annual Exam    pap    HPI Has been having intermittent itching of the perineal skin for months - will be present for 1-2 wks or even just an hr or two at a time before spontenously resolving over the past 3 months. She does not currently have the rash, is not having any vaginal discharge, has not tried anything, and has not seen a physician about this prior.  Is fasting today, no vitamins or supplements, no medications.  Gets some exercise but walking for 30 min to 1 hr, sweats but does not get to the point where she is winded. Not following any specific diet.  Pt speaks very limited [place] - she states she is able to understand more than she can speak but not confirmed.  Pt's husband translated for her today while their 484 yo child was present.  He will start pre-K in the fall. She also has 43 yo twins- boy and girl - in elementary She desires additional children and so is not using birth control - she is not interested in pursuing high risk conception/fertility treatments - not even sure she wants to take pnv.  Pt reports h/o c/s only - husband reports female circumcision when pt was 44 yo.   History reviewed. No pertinent past medical history. Past Surgical History  Procedure Laterality Date  . Cesarean section     No current outpatient prescriptions on file prior to visit.   No current facility-administered medications on file prior to visit.   No Known Allergies Family History  Problem Relation Age of Onset  . Diabetes Brother   . Hypertension Brother    History   Social History  . Marital Status: Married    Spouse Name: N/A  . Number of Children: N/A  . Years of Education: N/A   Social History Main Topics  . Smoking status: Never Smoker   . Smokeless tobacco: Not on file  . Alcohol Use: No  . Drug Use: No  . Sexual Activity: No    Other Topics Concern  . None   Social History Narrative      Review of Systems See sheet    Objective:  BP 118/70 mmHg  Pulse 96  Temp(Src) 98.2 F (36.8 C) (Oral)  Resp 18  Ht 5\' 9"  (1.753 m)  Wt 222 lb (100.699 kg)  BMI 32.77 kg/m2  SpO2 99%  LMP 06/06/2014  Physical Exam  Constitutional: She is oriented to person, place, and time. She appears well-developed and well-nourished. No distress.  HENT:  Head: Normocephalic and atraumatic.  Right Ear: Tympanic membrane, external ear and ear canal normal.  Left Ear: Tympanic membrane, external ear and ear canal normal.  Nose: Mucosal edema present. No rhinorrhea.  Mouth/Throat: Uvula is midline, oropharynx is clear and moist and mucous membranes are normal. No posterior oropharyngeal erythema.  Eyes: Conjunctivae and EOM are normal. Pupils are equal, round, and reactive to light. Right eye exhibits no discharge. Left eye exhibits no discharge. No scleral icterus.  Neck: Normal range of motion. Neck supple. No thyroid mass present.  Question of some subtle thyromegally bilatreally  Cardiovascular: Normal rate, regular rhythm, normal heart sounds and intact distal pulses.   Pulmonary/Chest: Effort normal and breath sounds normal. No respiratory distress.  Abdominal: Soft. Bowel sounds are normal. There is no tenderness.  Genitourinary: No breast swelling, tenderness, discharge or bleeding. There is rash on the right labia. There is no tenderness or lesion on the right labia. There is rash on the left labia. There is no tenderness or lesion on the left labia. Uterus is enlarged and tender. Uterus is not deviated and not fixed. Cervix exhibits no motion tenderness, no discharge and no friability. Right adnexum displays tenderness and fullness. Right adnexum displays no mass. Left adnexum displays tenderness. Left adnexum displays no mass. There is erythema, tenderness and bleeding in the vagina. No foreign body around the vagina. No  signs of injury around the vagina. Vaginal discharge found.  Copious amount of thick yellow curd dry discharge  Musculoskeletal: She exhibits no edema.  Lymphadenopathy:    She has no cervical adenopathy.  Neurological: She is alert and oriented to person, place, and time. She has normal reflexes.  Skin: Skin is warm and dry. She is not diaphoretic. No erythema.  Psychiatric: She has a normal mood and affect. Her behavior is normal.  breast exam difficult to complete due to dense pendulous large breasts  Results for orders placed or performed in visit on 07/17/14  POCT UA - Microscopic Only  Result Value Ref Range   WBC, Ur, HPF, POC 0-2    RBC, urine, microscopic 2-6    Bacteria, U Microscopic trace    Mucus, UA neg    Epithelial cells, urine per micros 0-2    Crystals, Ur, HPF, POC neg    Casts, Ur, LPF, POC neg    Yeast, UA neg   POCT urinalysis dipstick  Result Value Ref Range   Color, UA yellow    Clarity, UA clear    Glucose, UA neg    Bilirubin, UA neg    Ketones, UA neg    Spec Grav, UA 1.020    Blood, UA neg    pH, UA 7.0    Protein, UA neg    Urobilinogen, UA 0.2    Nitrite, UA neg    Leukocytes, UA small (1+)   POCT urine pregnancy  Result Value Ref Range   Preg Test, Ur Negative   POCT Wet Prep with KOH  Result Value Ref Range   Trichomonas, UA Negative    Clue Cells Wet Prep HPF POC 0-2    Epithelial Wet Prep HPF POC 7-12    Yeast Wet Prep HPF POC positive    Bacteria Wet Prep HPF POC 3+    RBC Wet Prep HPF POC 0-5    WBC Wet Prep HPF POC 20-tntc    KOH Prep POC Positive   POCT glucose (manual entry)  Result Value Ref Range   POC Glucose 78 70 - 99 mg/dl  POCT glycosylated hemoglobin (Hb A1C)  Result Value Ref Range   Hemoglobin A1C 5.4   POCT Skin KOH  Result Value Ref Range   Skin KOH, POC Negative    [facility] reading (PRIMARY) by  Dr. Clelia Croft. EKG: Flipped T waves lead V3, none prior for comparison     Assessment & Plan:   1. Health maintenance  examination   2. Screening for breast cancer   3. Screening for cardiovascular, respiratory, and genitourinary diseases   4. Urinary frequency   5. Screening for cervical cancer   6. Screening for diabetes mellitus   7. Nocturia   8. Screening for deficiency anemia   9. Screening for thyroid disorder   10. Perineal pruritus in female   7. Tinea cruris  12. Vaginal moniliasis   13. Suprapubic tenderness     Orders Placed This Encounter  Procedures  . MM Digital Screening    Standing Status: Future     Number of Occurrences:      Standing Expiration Date: 09/16/2015    Order Specific Question:  Reason for Exam (SYMPTOM  OR DIAGNOSIS REQUIRED)    Answer:  screening    Order Specific Question:  Is the patient pregnant?    Answer:  No    Order Specific Question:  Preferred imaging location?    Answer[facility]  . Tdap vaccine greater than or equal to 7yo IM  . CBC  . Comprehensive metabolic panel    Order Specific Question:  Has the patient fasted?    Answer:  Yes  . Lipid panel    Order Specific Question:  Has the patient fasted?    Answer:  Yes  . TSH  . Vit D  25 hydroxy (rtn osteoporosis monitoring)  . POCT UA - Microscopic Only  . POCT urinalysis dipstick  . POCT urine pregnancy  . POCT Wet Prep with KOH  . POCT glucose (manual entry)  . POCT glycosylated hemoglobin (Hb A1C)  . POCT Skin KOH  . EKG 12-Lead    Meds ordered this encounter  Medications  . Prenatal Multivit-Min-Fe-FA (PRENATAL VITAMINS) 0.8 MG tablet    Sig: Take 1 tablet by mouth daily.    Dispense:  90 tablet    Refill:  12  . fluconazole (DIFLUCAN) 150 MG tablet    Sig: Take 1 tablet (150 mg total) by mouth every 3 (three) days.    Dispense:  3 tablet    Refill:  0  . clotrimazole (LOTRIMIN) 1 % cream    Sig: Apply 1 application topically 2 (two) times daily.    Dispense:  113 g    Refill:  0     Norberto SorensonEva Shaw, MD MPH

## 2014-07-17 NOTE — Patient Instructions (Addendum)
Health Maintenance Adopting a healthy lifestyle and getting preventive care can go a long way to promote health and wellness. Talk with your health care provider about what schedule of regular examinations is right for you. This is a good chance for you to check in with your provider about disease prevention and staying healthy. In between checkups, there are plenty of things you can do on your own. Experts have done a lot of research about which lifestyle changes and preventive measures are most likely to keep you healthy. Ask your health care provider for more information. WEIGHT AND DIET  Eat a healthy diet  Be sure to include plenty of vegetables, fruits, low-fat dairy products, and lean protein.  Do not eat a lot of foods high in solid fats, added sugars, or salt.  Get regular exercise. This is one of the most important things you can do for your health.  Most adults should exercise for at least 150 minutes each week. The exercise should increase your heart rate and make you sweat (moderate-intensity exercise).  Most adults should also do strengthening exercises at least twice a week. This is in addition to the moderate-intensity exercise.  Maintain a healthy weight  Body mass index (BMI) is a measurement that can be used to identify possible weight problems. It estimates body fat based on height and weight. Your health care provider can help determine your BMI and help you achieve or maintain a healthy weight.  For females 51 years of age and older:   A BMI below 18.5 is considered underweight.  A BMI of 18.5 to 24.9 is normal.  A BMI of 25 to 29.9 is considered overweight.  A BMI of 30 and above is considered obese.  Watch levels of cholesterol and blood lipids  You should start having your blood tested for lipids and cholesterol at 43 years of age, then have this test every 5 years.  You may need to have your cholesterol levels checked more often if:  Your lipid or  cholesterol levels are high.  You are older than 43 years of age.  You are at high risk for heart disease.  CANCER SCREENING   Lung Cancer  Lung cancer screening is recommended for adults 42-13 years old who are at high risk for lung cancer because of a history of smoking.  A yearly low-dose CT scan of the lungs is recommended for people who:  Currently smoke.  Have quit within the past 15 years.  Have at least a 30-pack-year history of smoking. A pack year is smoking an average of one pack of cigarettes a day for 1 year.  Yearly screening should continue until it has been 15 years since you quit.  Yearly screening should stop if you develop a health problem that would prevent you from having lung cancer treatment.  Breast Cancer  Practice breast self-awareness. This means understanding how your breasts normally appear and feel.  It also means doing regular breast self-exams. Let your health care provider know about any changes, no matter how small.  If you are in your 20s or 30s, you should have a clinical breast exam (CBE) by a health care provider every 1-3 years as part of a regular health exam.  If you are 69 or older, have a CBE every year. Also consider having a breast X-ray (mammogram) every year.  If you have a family history of breast cancer, talk to your health care provider about genetic screening.  If you  are at high risk for breast cancer, talk to your health care provider about having an MRI and a mammogram every year.  Breast cancer gene (BRCA) assessment is recommended for women who have family members with BRCA-related cancers. BRCA-related cancers include:  Breast.  Ovarian.  Tubal.  Peritoneal cancers.  Results of the assessment will determine the need for genetic counseling and BRCA1 and BRCA2 testing. Cervical Cancer Routine pelvic examinations to screen for cervical cancer are no longer recommended for nonpregnant women who are considered low  risk for cancer of the pelvic organs (ovaries, uterus, and vagina) and who do not have symptoms. A pelvic examination may be necessary if you have symptoms including those associated with pelvic infections. Ask your health care provider if a screening pelvic exam is right for you.   The Pap test is the screening test for cervical cancer for women who are considered at risk.  If you had a hysterectomy for a problem that was not cancer or a condition that could lead to cancer, then you no longer need Pap tests.  If you are older than 65 years, and you have had normal Pap tests for the past 10 years, you no longer need to have Pap tests.  If you have had past treatment for cervical cancer or a condition that could lead to cancer, you need Pap tests and screening for cancer for at least 20 years after your treatment.  If you no longer get a Pap test, assess your risk factors if they change (such as having a new sexual partner). This can affect whether you should start being screened again.  Some women have medical problems that increase their chance of getting cervical cancer. If this is the case for you, your health care provider may recommend more frequent screening and Pap tests.  The human papillomavirus (HPV) test is another test that may be used for cervical cancer screening. The HPV test looks for the virus that can cause cell changes in the cervix. The cells collected during the Pap test can be tested for HPV.  The HPV test can be used to screen women 55 years of age and older. Getting tested for HPV can extend the interval between normal Pap tests from three to five years.  An HPV test also should be used to screen women of any age who have unclear Pap test results.  After 42 years of age, women should have HPV testing as often as Pap tests.  Colorectal Cancer  This type of cancer can be detected and often prevented.  Routine colorectal cancer screening usually begins at 43 years of  age and continues through 43 years of age.  Your health care provider may recommend screening at an earlier age if you have risk factors for colon cancer.  Your health care provider may also recommend using home test kits to check for hidden blood in the stool.  A small camera at the end of a tube can be used to examine your colon directly (sigmoidoscopy or colonoscopy). This is done to check for the earliest forms of colorectal cancer.  Routine screening usually begins at age 77.  Direct examination of the colon should be repeated every 5-10 years through 43 years of age. However, you may need to be screened more often if early forms of precancerous polyps or small growths are found. Skin Cancer  Check your skin from head to toe regularly.  Tell your health care provider about any new moles or changes  moles, especially if there is a change in a mole's shape or color.  Also tell your health care provider if you have a mole that is larger than the size of a pencil eraser.  Always use sunscreen. Apply sunscreen liberally and repeatedly throughout the day.  Protect yourself by wearing long sleeves, pants, a wide-brimmed hat, and sunglasses whenever you are outside. HEART DISEASE, DIABETES, AND HIGH BLOOD PRESSURE   Have your blood pressure checked at least every 1-2 years. High blood pressure causes heart disease and increases the risk of stroke.  If you are between 55 years and 79 years old, ask your health care provider if you should take aspirin to prevent strokes.  Have regular diabetes screenings. This involves taking a blood sample to check your fasting blood sugar level.  If you are at a normal weight and have a low risk for diabetes, have this test once every three years after 43 years of age.  If you are overweight and have a high risk for diabetes, consider being tested at a younger age or more often. PREVENTING INFECTION  Hepatitis B  If you have a higher risk for  hepatitis B, you should be screened for this virus. You are considered at high risk for hepatitis B if:  You were born in a country where hepatitis B is common. Ask your health care provider which countries are considered high risk.  Your parents were born in a high-risk country, and you have not been immunized against hepatitis B (hepatitis B vaccine).  You have HIV or AIDS.  You use needles to inject street drugs.  You live with someone who has hepatitis B.  You have had sex with someone who has hepatitis B.  You get hemodialysis treatment.  You take certain medicines for conditions, including cancer, organ transplantation, and autoimmune conditions. Hepatitis C  Blood testing is recommended for:  Everyone born from 1945 through 1965.  Anyone with known risk factors for hepatitis C. Sexually transmitted infections (STIs)  You should be screened for sexually transmitted infections (STIs) including gonorrhea and chlamydia if:  You are sexually active and are younger than 43 years of age.  You are older than 43 years of age and your health care provider tells you that you are at risk for this type of infection.  Your sexual activity has changed since you were last screened and you are at an increased risk for chlamydia or gonorrhea. Ask your health care provider if you are at risk.  If you do not have HIV, but are at risk, it may be recommended that you take a prescription medicine daily to prevent HIV infection. This is called pre-exposure prophylaxis (PrEP). You are considered at risk if:  You are sexually active and do not regularly use condoms or know the HIV status of your partner(s).  You take drugs by injection.  You are sexually active with a partner who has HIV. Talk with your health care provider about whether you are at high risk of being infected with HIV. If you choose to begin PrEP, you should first be tested for HIV. You should then be tested every 3 months for  as long as you are taking PrEP.  PREGNANCY   If you are premenopausal and you may become pregnant, ask your health care provider about preconception counseling.  If you may become pregnant, take 400 to 800 micrograms (mcg) of folic acid every day.  If you want to prevent pregnancy, talk to your   your health care provider about birth control (contraception). OSTEOPOROSIS AND MENOPAUSE   Osteoporosis is a disease in which the bones lose minerals and strength with aging. This can result in serious bone fractures. Your risk for osteoporosis can be identified using a bone density scan.  If you are 17 years of age or older, or if you are at risk for osteoporosis and fractures, ask your health care provider if you should be screened.  Ask your health care provider whether you should take a calcium or vitamin D supplement to lower your risk for osteoporosis.  Menopause may have certain physical symptoms and risks.  Hormone replacement therapy may reduce some of these symptoms and risks. Talk to your health care provider about whether hormone replacement therapy is right for you.  HOME CARE INSTRUCTIONS   Schedule regular health, dental, and eye exams.  Stay current with your immunizations.   Do not use any tobacco products including cigarettes, chewing tobacco, or electronic cigarettes.  If you are pregnant, do not drink alcohol.  If you are breastfeeding, limit how much and how often you drink alcohol.  Limit alcohol intake to no more than 1 drink per day for nonpregnant women. One drink equals 12 ounces of beer, 5 ounces of wine, or 1 ounces of hard liquor.  Do not use street drugs.  Do not share needles.  Ask your health care provider for help if you need support or information about quitting drugs.  Tell your health care provider if you often feel depressed.  Tell your health care provider if you have ever been abused or do not feel safe at home. Document Released: 08/30/2010  Document Revised: 07/01/2013 Document Reviewed: 01/16/2013 [facility] Patient Information 2015 [place], [place]. This information is not intended to replace advice given to you by your health care provider. Make sure you discuss any questions you have with your health care provider. Monilial Vaginitis Vaginitis in a soreness, swelling and redness (inflammation) of the vagina and vulva. Monilial vaginitis is not a sexually transmitted infection. CAUSES  Yeast vaginitis is caused by yeast (candida) that is normally found in your vagina. With a yeast infection, the candida has overgrown in number to a point that upsets the chemical balance. SYMPTOMS  White, thick vaginal discharge. Swelling, itching, redness and irritation of the vagina and possibly the lips of the vagina (vulva). Burning or painful urination. Painful intercourse. DIAGNOSIS  Things that may contribute to monilial vaginitis are: Postmenopausal and virginal states. Pregnancy. Infections. Being tired, sick or stressed, especially if you had monilial vaginitis in the past. Diabetes. Good control will help lower the chance. Birth control pills. Tight fitting garments. Using bubble bath, feminine sprays, douches or deodorant tampons. Taking certain medications that kill germs (antibiotics). Sporadic recurrence can occur if you become ill. TREATMENT  Your caregiver will give you medication. There are several kinds of anti monilial vaginal creams and suppositories specific for monilial vaginitis. For recurrent yeast infections, use a suppository or cream in the vagina 2 times a week, or as directed. Anti-monilial or steroid cream for the itching or irritation of the vulva may also be used. Get your caregiver's permission. Painting the vagina with methylene blue solution may help if the monilial cream does not work. Eating yogurt may help prevent monilial vaginitis. HOME CARE INSTRUCTIONS  Finish all medication as prescribed. Do not  have sex until treatment is completed or after your caregiver tells you it is okay. Take warm sitz baths. Do not douche. Do not  use tampons, especially scented ones. Wear cotton underwear. Avoid tight pants and panty hose. Tell your sexual partner that you have a yeast infection. They should go to their caregiver if they have symptoms such as mild rash or itching. Your sexual partner should be treated as well if your infection is difficult to eliminate. Practice safer sex. Use condoms. Some vaginal medications cause latex condoms to fail. Vaginal medications that harm condoms are: Cleocin cream. Butoconazole (Femstat). Terconazole (Terazol) vaginal suppository. Miconazole (Monistat) (may be purchased over the counter). SEEK MEDICAL CARE IF:  You have a temperature by mouth above 102 F (38.9 C). The infection is getting worse after 2 days of treatment. The infection is not getting better after 3 days of treatment. You develop blisters in or around your vagina. You develop vaginal bleeding, and it is not your menstrual period. You have pain when you urinate. You develop intestinal problems. You have pain with sexual intercourse. Document Released: 11/24/2004 Document Revised: 05/09/2011 Document Reviewed: 08/08/2008 [facility] Patient Information 2015 [place], [place]. This information is not intended to replace advice given to you by your health care provider. Make sure you discuss any questions you have with your health care provider.   Yeast Infection of the Skin Some yeast on the skin is normal, but sometimes it causes an infection. If you have a yeast  , it shows up as white or light brown patches on brown skin. You can see it better in the summer on tan skin. It causes light-colored holes in your suntan. It can happen on any area of the body. This cannot be passed from person to person. HOME CARE  Scrub your skin daily with a dandruff shampoo. Your rash may take a couple weeks  to get well.  Do not scratch or itch the rash. GET HELP RIGHT AWAY IF:   You get another infection from scratching. The skin may get warm, red, and may ooze fluid.  The infection does not seem to be getting better. MAKE SURE YOU:  Understand these instructions.  Will watch your condition.  Will get help right away if you are not doing well or get worse. Document Released: 01/28/2008 Document Revised: 05/09/2011 Document Reviewed: 01/28/2008 [facility] Patient Information 2015 [place], [place]. This information is not intended to replace advice given to you by your health care provider. Make sure you discuss any questions you have with your health care provider.

## 2014-07-18 ENCOUNTER — Encounter: Payer: Self-pay | Admitting: Family Medicine

## 2014-07-18 DIAGNOSIS — E559 Vitamin D deficiency, unspecified: Secondary | ICD-10-CM | POA: Insufficient documentation

## 2014-07-18 LAB — VITAMIN D 25 HYDROXY (VIT D DEFICIENCY, FRACTURES): VIT D 25 HYDROXY: 18 ng/mL — AB (ref 30–100)

## 2014-07-21 LAB — PAP IG AND HPV HIGH-RISK: HPV DNA High Risk: NOT DETECTED

## 2014-08-20 ENCOUNTER — Encounter: Payer: Self-pay | Admitting: Family Medicine

## 2014-08-20 ENCOUNTER — Telehealth: Payer: Self-pay

## 2014-08-20 NOTE — Telephone Encounter (Signed)
All labs were normal.  I just wrote her a letter. Her pap smear showed vaginal yeast infection but this was already known from wet prep at time of visit and so pt has been treated.  Her vitamin D is still to low but is better than previous.  Take 2000-5000u daily vitamin D.  LDL (bad) cholesterol is high but pt does not need any medication since she is otherwise health without other risk factors for heart disease.  Decrease diet in saturated fats and increase diet in omega-3s - less red meat and butter, more olive oil and fish.

## 2014-08-20 NOTE — Telephone Encounter (Signed)
No review on labs. Please advise. Labs from 07/17/2014?

## 2014-08-20 NOTE — Telephone Encounter (Signed)
Pt husband called checking on lab results for her.  Please advise [phone]

## 2014-08-21 ENCOUNTER — Telehealth: Payer: Self-pay | Admitting: Family Medicine

## 2014-08-21 NOTE — Telephone Encounter (Signed)
lmom that Dr Clelia Croft had put a letter in mail about her lab results

## 2014-08-22 ENCOUNTER — Ambulatory Visit: Payer: 59 | Admitting: Family Medicine

## 2015-04-06 ENCOUNTER — Other Ambulatory Visit: Payer: Self-pay | Admitting: Family Medicine

## 2015-04-06 DIAGNOSIS — Z1231 Encounter for screening mammogram for malignant neoplasm of breast: Secondary | ICD-10-CM

## 2015-10-15 ENCOUNTER — Ambulatory Visit (INDEPENDENT_AMBULATORY_CARE_PROVIDER_SITE_OTHER): Payer: 59 | Admitting: Family Medicine

## 2015-10-15 ENCOUNTER — Encounter: Payer: Self-pay | Admitting: Family Medicine

## 2015-10-15 VITALS — BP 120/84 | HR 94 | Temp 98.2°F | Resp 16 | Ht 69.0 in | Wt 230.4 lb

## 2015-10-15 DIAGNOSIS — Z1239 Encounter for other screening for malignant neoplasm of breast: Secondary | ICD-10-CM | POA: Diagnosis not present

## 2015-10-15 DIAGNOSIS — Z1329 Encounter for screening for other suspected endocrine disorder: Secondary | ICD-10-CM

## 2015-10-15 DIAGNOSIS — Z130 Encounter for screening for diseases of the blood and blood-forming organs and certain disorders involving the immune mechanism: Secondary | ICD-10-CM | POA: Diagnosis not present

## 2015-10-15 DIAGNOSIS — E559 Vitamin D deficiency, unspecified: Secondary | ICD-10-CM | POA: Diagnosis not present

## 2015-10-15 DIAGNOSIS — Z1383 Encounter for screening for respiratory disorder NEC: Secondary | ICD-10-CM | POA: Diagnosis not present

## 2015-10-15 DIAGNOSIS — Z136 Encounter for screening for cardiovascular disorders: Secondary | ICD-10-CM

## 2015-10-15 DIAGNOSIS — Z0000 Encounter for general adult medical examination without abnormal findings: Secondary | ICD-10-CM

## 2015-10-15 DIAGNOSIS — Z1389 Encounter for screening for other disorder: Secondary | ICD-10-CM

## 2015-10-15 DIAGNOSIS — N938 Other specified abnormal uterine and vaginal bleeding: Secondary | ICD-10-CM

## 2015-10-15 LAB — POC MICROSCOPIC URINALYSIS (UMFC): MUCUS RE: ABSENT

## 2015-10-15 LAB — CBC
HCT: 38.6 % (ref 35.0–45.0)
Hemoglobin: 12.8 g/dL (ref 11.7–15.5)
MCH: 28.8 pg (ref 27.0–33.0)
MCHC: 33.2 g/dL (ref 32.0–36.0)
MCV: 86.9 fL (ref 80.0–100.0)
MPV: 10.3 fL (ref 7.5–12.5)
PLATELETS: 410 10*3/uL — AB (ref 140–400)
RBC: 4.44 MIL/uL (ref 3.80–5.10)
RDW: 13.2 % (ref 11.0–15.0)
WBC: 6.1 10*3/uL (ref 3.8–10.8)

## 2015-10-15 LAB — COMPREHENSIVE METABOLIC PANEL
ALBUMIN: 3.8 g/dL (ref 3.6–5.1)
ALK PHOS: 61 U/L (ref 33–115)
ALT: 15 U/L (ref 6–29)
AST: 15 U/L (ref 10–30)
BILIRUBIN TOTAL: 0.4 mg/dL (ref 0.2–1.2)
BUN: 8 mg/dL (ref 7–25)
CALCIUM: 9.3 mg/dL (ref 8.6–10.2)
CO2: 26 mmol/L (ref 20–31)
CREATININE: 0.62 mg/dL (ref 0.50–1.10)
Chloride: 103 mmol/L (ref 98–110)
Glucose, Bld: 79 mg/dL (ref 65–99)
Potassium: 4.1 mmol/L (ref 3.5–5.3)
SODIUM: 137 mmol/L (ref 135–146)
Total Protein: 7.7 g/dL (ref 6.1–8.1)

## 2015-10-15 LAB — POCT URINALYSIS DIP (MANUAL ENTRY)
Bilirubin, UA: NEGATIVE
GLUCOSE UA: NEGATIVE
Ketones, POC UA: NEGATIVE
Leukocytes, UA: NEGATIVE
Nitrite, UA: NEGATIVE
PROTEIN UA: NEGATIVE
SPEC GRAV UA: 1.02
Urobilinogen, UA: 0.2
pH, UA: 5.5

## 2015-10-15 LAB — LIPID PANEL
CHOL/HDL RATIO: 3.6 ratio (ref <=5.0)
CHOLESTEROL: 253 mg/dL — AB (ref 125–200)
HDL: 71 mg/dL (ref >=46)
LDL Cholesterol: 166 mg/dL — ABNORMAL HIGH (ref <130)
Triglycerides: 81 mg/dL (ref <150)
VLDL: 16 mg/dL (ref <30)

## 2015-10-15 LAB — TSH: TSH: 1.06 m[IU]/L

## 2015-10-15 NOTE — Progress Notes (Signed)
Subjective:    Patient ID: Shannon Bowers, female    DOB: 03/19/1970, 45 y.o.   MRN: [id number] Chief Complaint  Patient presents with  . Annual Exam   HPI Pt denies any perineal rash, pruritis, pain, or vaginal discharge, has not tried anything, and has not seen a physician about this prior.  Is fasting today, no vitamins or supplements, no medications. Did completed the sev mos of high dose rx vist D I rx'ed her last yr.  Occ walking for exercise. Not following any specific diet.  For the past year her menses have been more irregular- they are always 5d long but vary from 12 to 35d between. No sig dysmenorrhea or heavy bleeding.  She occ feelings lightheaded and fatigued during her menses. She desires additional children and so is not using birth control - she is not interested in pursuing high risk conception/fertility treatments - not even sure she wants to take pnv.  Pt speaks very limited [place] -  Pt's husband translated for her today.    Her 905 yo son starts kindergartan in sev wks and also has 44 yo twins- boy and girl - in elementary   Pt reports h/o c/s only - husband reports female circumcision when pt was 45 yo.  No h/o prior mammograms. No known h/o breast ca in family.  No past medical history on file. Past Surgical History:  Procedure Laterality Date  . CESAREAN SECTION    . CIRCUMCISION N/A 1980   No current outpatient prescriptions on file prior to visit.   No current facility-administered medications on file prior to visit.    Not on File Family History  Problem Relation Age of Onset  . Diabetes Brother   . Hypertension Brother    Social History   Social History  . Marital status: Married    Spouse name: N/A  . Number of children: N/A  . Years of education: N/A   Social History Main Topics  . Smoking status: Never Smoker  . Smokeless tobacco: None  . Alcohol use No  . Drug use: No  . Sexual activity: No   Other Topics Concern  . None   Social  History Narrative  . None      Review of Systems  Genitourinary: Positive for menstrual problem.  All other systems reviewed and are negative.     Objective:  BP 120/84 (BP Location: Right Arm, Patient Position: Sitting, Cuff Size: Normal)   Pulse 94   Temp 98.2 F (36.8 C) (Oral)   Resp 16   Ht 5\' 9"  (1.753 m)   Wt 230 lb 6.4 oz (104.5 kg)   LMP 10/13/2015 Comment: this week she is menustrating  SpO2 98%   BMI 34.02 kg/m    Visual Acuity Screening   Right eye Left eye Both eyes  Without correction: 20/30 20/40 20/25   With correction:      \  Physical Exam  Constitutional: She is oriented to person, place, and time. She appears well-developed and well-nourished. No distress.  HENT:  Head: Normocephalic and atraumatic.  Right Ear: Tympanic membrane, external ear and ear canal normal.  Left Ear: Tympanic membrane, external ear and ear canal normal.  Nose: Nose normal. No mucosal edema or rhinorrhea.  Mouth/Throat: Uvula is midline, oropharynx is clear and moist and mucous membranes are normal. No posterior oropharyngeal erythema.  Eyes: Conjunctivae and EOM are normal. Pupils are equal, round, and reactive to light. Right eye exhibits no discharge. Left eye exhibits  no discharge. No scleral icterus.  Neck: Normal range of motion. Neck supple. No thyroid mass present.  Cardiovascular: Normal rate, regular rhythm, normal heart sounds and intact distal pulses.   Pulmonary/Chest: Effort normal and breath sounds normal. No respiratory distress.  Abdominal: Soft. Bowel sounds are normal. There is no tenderness.  Genitourinary: No breast swelling, tenderness, discharge or bleeding.  Musculoskeletal: She exhibits no edema.  Lymphadenopathy:    She has no cervical adenopathy.  Neurological: She is alert and oriented to person, place, and time. She has normal reflexes.  Skin: Skin is warm and dry. She is not diaphoretic. No erythema.  Psychiatric: She has a normal mood and affect.  Her behavior is normal.  breast exam difficult to complete due to dense pendulous large breasts    Assessment & Plan:   1. Dysfunctional uterine bleeding - suspect physiologic due to weight gain and/or perimenopause - check labs.  2. Annual physical exam   3. Screening for breast cancer - [facility]  4. Screening for cardiovascular, respiratory, and genitourinary diseases   5. Screening for deficiency anemia   6. Screening for thyroid disorder   7. Vitamin D deficiency - completed rx supp last yr, not currently taking any supp   At next visit, will recommend going to eye doctor.  Orders Placed This Encounter  Procedures  . TSH  . FSH/LH  . CBC  . Lipid panel    Order Specific Question:   Has the patient fasted?    Answer:   Yes  . Comprehensive metabolic panel    Order Specific Question:   Has the patient fasted?    Answer:   Yes  . VITAMIN D 25 Hydroxy (Vit-D Deficiency, Fractures)  . POCT urinalysis dipstick  . POCT Microscopic Urinalysis ([facility])     Norberto SorensonEva Shaw, M.D.  [facility]  [place] [address] [place], [place] [address] [phone] phone [phone] fax  10/16/15 4:30 PM

## 2015-10-15 NOTE — Patient Instructions (Addendum)
   IF you received an x-ray today, you will receive an invoice from [facility]. Please contact [facility] at [phone] with questions or concerns regarding your invoice.   IF you received labwork today, you will receive an invoice from [facility]. Please contact [facility] at [phone] with questions or concerns regarding your invoice.   Our billing staff will not be able to assist you with questions regarding bills from these companies.  You will be contacted with the lab results as soon as they are available. The fastest way to get your results is to activate your My Chart account. Instructions are located on the last page of this paperwork. If you have not heard from us regarding the results in 2 weeks, please contact this office.     Health Maintenance, Female Adopting a healthy lifestyle and getting preventive care can go a long way to promote health and wellness. Talk with your health care provider about what schedule of regular examinations is right for you. This is a good chance for you to check in with your provider about disease prevention and staying healthy. In between checkups, there are plenty of things you can do on your own. Experts have done a lot of research about which lifestyle changes and preventive measures are most likely to keep you healthy. Ask your health care provider for more information. WEIGHT AND DIET  Eat a healthy diet  Be sure to include plenty of vegetables, fruits, low-fat dairy products, and lean protein.  Do not eat a lot of foods high in solid fats, added sugars, or salt.  Get regular exercise. This is one of the most important things you can do for your health.  Most adults should exercise for at least 150 minutes each week. The exercise should increase your heart rate and make you sweat (moderate-intensity exercise).  Most adults should also do strengthening exercises at least twice a week. This  is in addition to the moderate-intensity exercise.  Maintain a healthy weight  Body mass index (BMI) is a measurement that can be used to identify possible weight problems. It estimates body fat based on height and weight. Your health care provider can help determine your BMI and help you achieve or maintain a healthy weight.  For females 20 years of age and older:   A BMI below 18.5 is considered underweight.  A BMI of 18.5 to 24.9 is normal.  A BMI of 25 to 29.9 is considered overweight.  A BMI of 30 and above is considered obese.  Watch levels of cholesterol and blood lipids  You should start having your blood tested for lipids and cholesterol at 44 years of age, then have this test every 5 years.  You may need to have your cholesterol levels checked more often if:  Your lipid or cholesterol levels are high.  You are older than 44 years of age.  You are at high risk for heart disease.  CANCER SCREENING   Lung Cancer  Lung cancer screening is recommended for adults 55-80 years old who are at high risk for lung cancer because of a history of smoking.  A yearly low-dose CT scan of the lungs is recommended for people who:  Currently smoke.  Have quit within the past 15 years.  Have at least a 30-pack-year history of smoking. A pack year is smoking an average of one pack of cigarettes a day for 1 year.  Yearly screening should continue until it has been   15 years since you quit.  Yearly screening should stop if you develop a health problem that would prevent you from having lung cancer treatment.  Breast Cancer  Practice breast self-awareness. This means understanding how your breasts normally appear and feel.  It also means doing regular breast self-exams. Let your health care provider know about any changes, no matter how small.  If you are in your 20s or 30s, you should have a clinical breast exam (CBE) by a health care provider every 1-3 years as part of a  regular health exam.  If you are 40 or older, have a CBE every year. Also consider having a breast X-ray (mammogram) every year.  If you have a family history of breast cancer, talk to your health care provider about genetic screening.  If you are at high risk for breast cancer, talk to your health care provider about having an MRI and a mammogram every year.  Breast cancer gene (BRCA) assessment is recommended for women who have family members with BRCA-related cancers. BRCA-related cancers include:  Breast.  Ovarian.  Tubal.  Peritoneal cancers.  Results of the assessment will determine the need for genetic counseling and BRCA1 and BRCA2 testing. Cervical Cancer Your health care provider may recommend that you be screened regularly for cancer of the pelvic organs (ovaries, uterus, and vagina). This screening involves a pelvic examination, including checking for microscopic changes to the surface of your cervix (Pap test). You may be encouraged to have this screening done every 3 years, beginning at age 21.  For women ages 30-65, health care providers may recommend pelvic exams and Pap testing every 3 years, or they may recommend the Pap and pelvic exam, combined with testing for human papilloma virus (HPV), every 5 years. Some types of HPV increase your risk of cervical cancer. Testing for HPV may also be done on women of any age with unclear Pap test results.  Other health care providers may not recommend any screening for nonpregnant women who are considered low risk for pelvic cancer and who do not have symptoms. Ask your health care provider if a screening pelvic exam is right for you.  If you have had past treatment for cervical cancer or a condition that could lead to cancer, you need Pap tests and screening for cancer for at least 20 years after your treatment. If Pap tests have been discontinued, your risk factors (such as having a new sexual partner) need to be reassessed to  determine if screening should resume. Some women have medical problems that increase the chance of getting cervical cancer. In these cases, your health care provider may recommend more frequent screening and Pap tests. Colorectal Cancer  This type of cancer can be detected and often prevented.  Routine colorectal cancer screening usually begins at 44 years of age and continues through 44 years of age.  Your health care provider may recommend screening at an earlier age if you have risk factors for colon cancer.  Your health care provider may also recommend using home test kits to check for hidden blood in the stool.  A small camera at the end of a tube can be used to examine your colon directly (sigmoidoscopy or colonoscopy). This is done to check for the earliest forms of colorectal cancer.  Routine screening usually begins at age 50.  Direct examination of the colon should be repeated every 5-10 years through 44 years of age. However, you may need to be screened more often if   early forms of precancerous polyps or small growths are found. Skin Cancer  Check your skin from head to toe regularly.  Tell your health care provider about any new moles or changes in moles, especially if there is a change in a mole's shape or color.  Also tell your health care provider if you have a mole that is larger than the size of a pencil eraser.  Always use sunscreen. Apply sunscreen liberally and repeatedly throughout the day.  Protect yourself by wearing long sleeves, pants, a wide-brimmed hat, and sunglasses whenever you are outside. HEART DISEASE, DIABETES, AND HIGH BLOOD PRESSURE   High blood pressure causes heart disease and increases the risk of stroke. High blood pressure is more likely to develop in:  People who have blood pressure in the high end of the normal range (130-139/85-89 mm Hg).  People who are overweight or obese.  People who are African American.  If you are 18-39 years of  age, have your blood pressure checked every 3-5 years. If you are 40 years of age or older, have your blood pressure checked every year. You should have your blood pressure measured twice--once when you are at a hospital or clinic, and once when you are not at a hospital or clinic. Record the average of the two measurements. To check your blood pressure when you are not at a hospital or clinic, you can use:  An automated blood pressure machine at a pharmacy.  A home blood pressure monitor.  If you are between 55 years and 79 years old, ask your health care provider if you should take aspirin to prevent strokes.  Have regular diabetes screenings. This involves taking a blood sample to check your fasting blood sugar level.  If you are at a normal weight and have a low risk for diabetes, have this test once every three years after 45 years of age.  If you are overweight and have a high risk for diabetes, consider being tested at a younger age or more often. PREVENTING INFECTION  Hepatitis B  If you have a higher risk for hepatitis B, you should be screened for this virus. You are considered at high risk for hepatitis B if:  You were born in a country where hepatitis B is common. Ask your health care provider which countries are considered high risk.  Your parents were born in a high-risk country, and you have not been immunized against hepatitis B (hepatitis B vaccine).  You have HIV or AIDS.  You use needles to inject street drugs.  You live with someone who has hepatitis B.  You have had sex with someone who has hepatitis B.  You get hemodialysis treatment.  You take certain medicines for conditions, including cancer, organ transplantation, and autoimmune conditions. Hepatitis C  Blood testing is recommended for:  Everyone born from 1945 through 1965.  Anyone with known risk factors for hepatitis C. Sexually transmitted infections (STIs)  You should be screened for sexually  transmitted infections (STIs) including gonorrhea and chlamydia if:  You are sexually active and are younger than 44 years of age.  You are older than 44 years of age and your health care provider tells you that you are at risk for this type of infection.  Your sexual activity has changed since you were last screened and you are at an increased risk for chlamydia or gonorrhea. Ask your health care provider if you are at risk.  If you do not have HIV, but   are at risk, it may be recommended that you take a prescription medicine daily to prevent HIV infection. This is called pre-exposure prophylaxis (PrEP). You are considered at risk if:  You are sexually active and do not regularly use condoms or know the HIV status of your partner(s).  You take drugs by injection.  You are sexually active with a partner who has HIV. Talk with your health care provider about whether you are at high risk of being infected with HIV. If you choose to begin PrEP, you should first be tested for HIV. You should then be tested every 3 months for as long as you are taking PrEP.  PREGNANCY   If you are premenopausal and you may become pregnant, ask your health care provider about preconception counseling.  If you may become pregnant, take 400 to 800 micrograms (mcg) of folic acid every day.  If you want to prevent pregnancy, talk to your health care provider about birth control (contraception). OSTEOPOROSIS AND MENOPAUSE   Osteoporosis is a disease in which the bones lose minerals and strength with aging. This can result in serious bone fractures. Your risk for osteoporosis can be identified using a bone density scan.  If you are 65 years of age or older, or if you are at risk for osteoporosis and fractures, ask your health care provider if you should be screened.  Ask your health care provider whether you should take a calcium or vitamin D supplement to lower your risk for osteoporosis.  Menopause may have  certain physical symptoms and risks.  Hormone replacement therapy may reduce some of these symptoms and risks. Talk to your health care provider about whether hormone replacement therapy is right for you.  HOME CARE INSTRUCTIONS   Schedule regular health, dental, and eye exams.  Stay current with your immunizations.   Do not use any tobacco products including cigarettes, chewing tobacco, or electronic cigarettes.  If you are pregnant, do not drink alcohol.  If you are breastfeeding, limit how much and how often you drink alcohol.  Limit alcohol intake to no more than 1 drink per day for nonpregnant women. One drink equals 12 ounces of beer, 5 ounces of wine, or 1 ounces of hard liquor.  Do not use street drugs.  Do not share needles.  Ask your health care provider for help if you need support or information about quitting drugs.  Tell your health care provider if you often feel depressed.  Tell your health care provider if you have ever been abused or do not feel safe at home.   This information is not intended to replace advice given to you by your health care provider. Make sure you discuss any questions you have with your health care provider.   Document Released: 08/30/2010 Document Revised: 03/07/2014 Document Reviewed: 01/16/2013 Elsevier Interactive Patient Education 2016 Elsevier Inc.  

## 2015-10-16 LAB — FSH/LH
FSH: 6.7 m[IU]/mL
LH: 5.4 m[IU]/mL

## 2015-10-16 LAB — VITAMIN D 25 HYDROXY (VIT D DEFICIENCY, FRACTURES): Vit D, 25-Hydroxy: 18 ng/mL — ABNORMAL LOW (ref 30–100)

## 2015-10-20 ENCOUNTER — Encounter: Payer: Self-pay | Admitting: Family Medicine

## 2015-11-24 ENCOUNTER — Other Ambulatory Visit: Payer: Self-pay | Admitting: Family Medicine

## 2015-11-24 DIAGNOSIS — Z1231 Encounter for screening mammogram for malignant neoplasm of breast: Secondary | ICD-10-CM

## 2015-11-30 ENCOUNTER — Ambulatory Visit
Admission: RE | Admit: 2015-11-30 | Discharge: 2015-11-30 | Disposition: A | Discharge status: Home / Self Care | Payer: Medicaid Other | Source: Ambulatory Visit | Attending: Family Medicine | Admitting: Family Medicine

## 2015-11-30 DIAGNOSIS — Z1231 Encounter for screening mammogram for malignant neoplasm of breast: Secondary | ICD-10-CM

## 2016-03-18 ENCOUNTER — Ambulatory Visit (INDEPENDENT_AMBULATORY_CARE_PROVIDER_SITE_OTHER): Payer: 59 | Admitting: Family Medicine

## 2016-03-18 VITALS — BP 110/76 | HR 119 | Temp 98.5°F | Resp 16 | Ht 69.0 in | Wt 230.6 lb

## 2016-03-18 DIAGNOSIS — R072 Precordial pain: Secondary | ICD-10-CM

## 2016-03-18 DIAGNOSIS — R000 Tachycardia, unspecified: Secondary | ICD-10-CM | POA: Diagnosis not present

## 2016-03-18 DIAGNOSIS — J101 Influenza due to other identified influenza virus with other respiratory manifestations: Secondary | ICD-10-CM | POA: Diagnosis not present

## 2016-03-18 DIAGNOSIS — R509 Fever, unspecified: Secondary | ICD-10-CM

## 2016-03-18 LAB — POCT INFLUENZA A/B
INFLUENZA A, POC: POSITIVE — AB
INFLUENZA B, POC: NEGATIVE

## 2016-03-18 MED ORDER — BENZONATATE 100 MG PO CAPS: 100.0000 mg | ORAL_CAPSULE | Freq: Two times a day (BID) | ORAL | 0 refills | Status: DC | PRN

## 2016-03-18 MED ORDER — OSELTAMIVIR PHOSPHATE 75 MG PO CAPS: 75.0000 mg | ORAL_CAPSULE | Freq: Every day | ORAL | 0 refills | Status: AC

## 2016-03-18 NOTE — Patient Instructions (Addendum)
   IF you received an x-ray today, you will receive an invoice from [facility]. Please contact [facility] at [phone] with questions or concerns regarding your invoice.   IF you received labwork today, you will receive an invoice from [facility]. Please contact [facility] at [phone] with questions or concerns regarding your invoice.   Our billing staff will not be able to assist you with questions regarding bills from these companies.  You will be contacted with the lab results as soon as they are available. The fastest way to get your results is to activate your My Chart account. Instructions are located on the last page of this paperwork. If you have not heard from us regarding the results in 2 weeks, please contact this office.      Influenza, Adult Influenza, more commonly known as "the flu," is a viral infection that primarily affects the respiratory tract. The respiratory tract includes organs that help you breathe, such as the lungs, nose, and throat. The flu causes many common cold symptoms, as well as a high fever and body aches. The flu spreads easily from person to person (is contagious). Getting a flu shot (influenza vaccination) every year is the best way to prevent influenza. What are the causes? Influenza is caused by a virus. You can catch the virus by:  Breathing in droplets from an infected person's cough or sneeze.  Touching something that was recently contaminated with the virus and then touching your mouth, nose, or eyes.  What increases the risk? The following factors may make you more likely to get the flu:  Not cleaning your hands frequently with soap and water or alcohol-based hand sanitizer.  Having close contact with many people during cold and flu season.  Touching your mouth, eyes, or nose without washing or sanitizing your hands first.  Not drinking enough fluids or not eating a healthy diet.  Not getting enough sleep or  exercise.  Being under a high amount of stress.  Not getting a yearly (annual) flu shot.  You may be at a higher risk of complications from the flu, such as a severe lung infection (pneumonia), if you:  Are over the age of 65.  Are pregnant.  Have a weakened disease-fighting system (immune system). You may have a weakened immune system if you: ? Have HIV or AIDS. ? Are undergoing chemotherapy. ? Aretaking medicines that reduce the activity of (suppress) the immune system.  Have a long-term (chronic) illness, such as heart disease, kidney disease, diabetes, or lung disease.  Have a liver disorder.  Are obese.  Have anemia.  What are the signs or symptoms? Symptoms of this condition typically last 4-10 days and may include:  Fever.  Chills.  Headache, body aches, or muscle aches.  Sore throat.  Cough.  Runny or congested nose.  Chest discomfort and cough.  Poor appetite.  Weakness or tiredness (fatigue).  Dizziness.  Nausea or vomiting.  How is this diagnosed? This condition may be diagnosed based on your medical history and a physical exam. Your health care provider may do a nose or throat swab test to confirm the diagnosis. How is this treated? If influenza is detected early, you can be treated with antiviral medicine that can reduce the length of your illness and the severity of your symptoms. This medicine may be given by mouth (orally) or through an IV tube that is inserted in one of your veins. The goal of treatment is to relieve symptoms by taking   care of yourself at home. This may include taking over-the-counter medicines, drinking plenty of fluids, and adding humidity to the air in your home. In some cases, influenza goes away on its own. Severe influenza or complications from influenza may be treated in a hospital. Follow these instructions at home:  Take over-the-counter and prescription medicines only as told by your health care provider.  Use a  cool mist humidifier to add humidity to the air in your home. This can make breathing easier.  Rest as needed.  Drink enough fluid to keep your urine clear or pale yellow.  Cover your mouth and nose when you cough or sneeze.  Wash your hands with soap and water often, especially after you cough or sneeze. If soap and water are not available, use hand sanitizer.  Stay home from work or school as told by your health care provider. Unless you are visiting your health care provider, try to avoid leaving home until your fever has been gone for 24 hours without the use of medicine.  Keep all follow-up visits as told by your health care provider. This is important. How is this prevented?  Getting an annual flu shot is the best way to avoid getting the flu. You may get the flu shot in late summer, fall, or winter. Ask your health care provider when you should get your flu shot.  Wash your hands often or use hand sanitizer often.  Avoid contact with people who are sick during cold and flu season.  Eat a healthy diet, drink plenty of fluids, get enough sleep, and exercise regularly. Contact a health care provider if:  You develop new symptoms.  You have: ? Chest pain. ? Diarrhea. ? A fever.  Your cough gets worse.  You produce more mucus.  You feel nauseous or you vomit. Get help right away if:  You develop shortness of breath or difficulty breathing.  Your skin or nails turn a bluish color.  You have severe pain or stiffness in your neck.  You develop a sudden headache or sudden pain in your face or ear.  You cannot stop vomiting. This information is not intended to replace advice given to you by your health care provider. Make sure you discuss any questions you have with your health care provider. Document Released: 02/12/2000 Document Revised: 07/23/2015 Document Reviewed: 12/09/2014 Elsevier Interactive Patient Education  2017 Elsevier Inc.  

## 2016-03-18 NOTE — Progress Notes (Signed)
  Chief Complaint  Patient presents with  . Headache    symptoms started on 03/17/16  . Cough    HPI Pt has cough and fevers and chills symptoms started 24 hours ago She denies sick contacts She reports headaches and body aches She has been drinking water But did not have appetite She did not get a flu vaccine this year She denies history of asthma She has nasal congestion and a runny nose as well.   She reports that she gets chest pain that is substernal and worse with coughing. She reports that her throat is sore.  She reports that her chest pain is 5/10 She would describe her chest pain as burning and only with coughing.   No past medical history on file.  Current Outpatient Prescriptions  Medication Sig Dispense Refill  . benzonatate (TESSALON) 100 MG capsule Take 1 capsule (100 mg total) by mouth 2 (two) times daily as needed for cough. 20 capsule 0  . oseltamivir (TAMIFLU) 75 MG capsule Take 1 capsule (75 mg total) by mouth daily. May dispense as generic 10 capsule 0   No current facility-administered medications for this visit.     Allergies: No Known Allergies  Past Surgical History:  Procedure Laterality Date  . CESAREAN SECTION    . CIRCUMCISION N/A 1980    Social History   Social History  . Marital status: Married    Spouse name: N/A  . Number of children: N/A  . Years of education: N/A   Social History Main Topics  . Smoking status: Never Smoker  . Smokeless tobacco: Never Used  . Alcohol use No  . Drug use: No  . Sexual activity: No   Other Topics Concern  . None   Social History Narrative  . None    ROS  Objective: Vitals:   03/18/16 1640 03/18/16 1644  BP: 110/76   Pulse: (!) 112 (!) 119  Resp: 16   Temp: 98.5 F (36.9 C)   TempSrc: Oral   SpO2: 99%   Weight: 230 lb 9.6 oz (104.6 kg)   Height: 5\' 9"  (1.753 m)     Physical Exam General: alert, oriented, in NAD Head: normocephalic, atraumatic, no sinus tenderness Eyes: EOM  intact, no scleral icterus or conjunctival injection Ears: TM clear bilaterally Throat: no pharyngeal exudate or erythema Lymph: no posterior auricular, submental or cervical lymph adenopathy Heart: normal rate, normal sinus rhythm, no murmurs Lungs: clear to auscultation bilaterally, no wheezing    Assessment and Plan Shannon Bowers was seen today for headache and cough.  Diagnoses and all orders for this visit:  Fever and chills Tachycardia Substernal chest pain Type A influenza  Gave work note tamiflu given  Advised pt to wear a mask while in public Stay home until fever free for 24 hours -     POCT Influenza A/B -     benzonatate (TESSALON) 100 MG capsule; Take 1 capsule (100 mg total) by mouth 2 (two) times daily as needed for cough. -     oseltamivir (TAMIFLU) 75 MG capsule; Take 1 capsule (75 mg total) by mouth daily. May dispense as generic     Shannon Bowers Camelia PhenesA Seneca Gadbois

## 2017-06-15 ENCOUNTER — Ambulatory Visit (INDEPENDENT_AMBULATORY_CARE_PROVIDER_SITE_OTHER): Payer: 59 | Admitting: Family Medicine

## 2017-06-15 ENCOUNTER — Encounter: Payer: Self-pay | Admitting: Family Medicine

## 2017-06-15 ENCOUNTER — Other Ambulatory Visit: Payer: Self-pay

## 2017-06-15 VITALS — BP 110/72 | HR 86 | Temp 98.3°F | Resp 18 | Ht 69.0 in | Wt 218.6 lb

## 2017-06-15 DIAGNOSIS — Z136 Encounter for screening for cardiovascular disorders: Secondary | ICD-10-CM

## 2017-06-15 DIAGNOSIS — E782 Mixed hyperlipidemia: Secondary | ICD-10-CM | POA: Diagnosis not present

## 2017-06-15 DIAGNOSIS — Z8632 Personal history of gestational diabetes: Secondary | ICD-10-CM

## 2017-06-15 DIAGNOSIS — B373 Candidiasis of vulva and vagina: Secondary | ICD-10-CM

## 2017-06-15 DIAGNOSIS — R3129 Other microscopic hematuria: Secondary | ICD-10-CM

## 2017-06-15 DIAGNOSIS — Z0000 Encounter for general adult medical examination without abnormal findings: Secondary | ICD-10-CM | POA: Diagnosis not present

## 2017-06-15 DIAGNOSIS — Z1383 Encounter for screening for respiratory disorder NEC: Secondary | ICD-10-CM

## 2017-06-15 DIAGNOSIS — Z1231 Encounter for screening mammogram for malignant neoplasm of breast: Secondary | ICD-10-CM | POA: Diagnosis not present

## 2017-06-15 DIAGNOSIS — E6609 Other obesity due to excess calories: Secondary | ICD-10-CM

## 2017-06-15 DIAGNOSIS — Z124 Encounter for screening for malignant neoplasm of cervix: Secondary | ICD-10-CM | POA: Diagnosis not present

## 2017-06-15 DIAGNOSIS — Z6832 Body mass index (BMI) 32.0-32.9, adult: Secondary | ICD-10-CM

## 2017-06-15 DIAGNOSIS — Z1329 Encounter for screening for other suspected endocrine disorder: Secondary | ICD-10-CM | POA: Diagnosis not present

## 2017-06-15 DIAGNOSIS — Z1389 Encounter for screening for other disorder: Secondary | ICD-10-CM | POA: Diagnosis not present

## 2017-06-15 DIAGNOSIS — N921 Excessive and frequent menstruation with irregular cycle: Secondary | ICD-10-CM | POA: Diagnosis not present

## 2017-06-15 DIAGNOSIS — Z130 Encounter for screening for diseases of the blood and blood-forming organs and certain disorders involving the immune mechanism: Secondary | ICD-10-CM

## 2017-06-15 DIAGNOSIS — B3731 Acute candidiasis of vulva and vagina: Secondary | ICD-10-CM

## 2017-06-15 DIAGNOSIS — E559 Vitamin D deficiency, unspecified: Secondary | ICD-10-CM | POA: Diagnosis not present

## 2017-06-15 LAB — POCT WET + KOH PREP
TRICH BY WET PREP: ABSENT
YEAST BY KOH: ABSENT
YEAST BY WET PREP: ABSENT

## 2017-06-15 LAB — POCT URINALYSIS DIP (MANUAL ENTRY)
Glucose, UA: NEGATIVE mg/dL
Leukocytes, UA: NEGATIVE
NITRITE UA: NEGATIVE
PH UA: 6 (ref 5.0–8.0)
Protein Ur, POC: NEGATIVE mg/dL
RBC UA: NEGATIVE
SPEC GRAV UA: 1.02 (ref 1.010–1.025)
Urobilinogen, UA: 0.2 E.U./dL

## 2017-06-15 LAB — POC MICROSCOPIC URINALYSIS (UMFC): Mucus: ABSENT

## 2017-06-15 MED ORDER — FLUCONAZOLE 150 MG PO TABS: 150.0000 mg | ORAL_TABLET | Freq: Once | ORAL | 0 refills | Status: AC

## 2017-06-15 NOTE — Patient Instructions (Addendum)
IF you received an x-ray today, you will receive an invoice from [facility]. Please contact [facility] at [phone] with questions or concerns regarding your invoice.   IF you received labwork today, you will receive an invoice from [place]. Please contact [facility] at [phone] with questions or concerns regarding your invoice.   Our billing staff will not be able to assist you with questions regarding bills from these companies.  You will be contacted with the lab results as soon as they are available. The fastest way to get your results is to activate your My Chart account. Instructions are located on the last page of this paperwork. If you have not heard from [place] regarding the results in 2 weeks, please contact this office.     Metrorrhagia Metrorrhagia is bleeding from the uterus that happens irregularly but often. The bleeding generally happens between menstrual periods. Follow these instructions at home: Pay attention to any changes in your symptoms. Follow these instructions to help with your condition: Eating and drinking  Eat well-balanced meals. Include foods that are high in iron, such as liver, meat, shellfish, green leafy vegetables, and eggs.  If you become constipated: ? Drink plenty of water. ? Eat fruits and vegetables that are high in water and fiber, such as spinach, carrots, raspberries, apples, and mango. Medicines  Take over-the-counter and prescription medicines only as told by your health care provider.  Do not change medicines without talking with your health care provider.  Aspirin or medicines that contain aspirin may make the bleeding worse. Do not take those medicines: ? During the week before your period. ? During your period.  If you were prescribed iron pills, take them as told by your health care provider. Iron pills help to replace iron that your body loses because of this condition. Activity  If you need to change  your sanitary pad or tampon more than one time every 2 hours: ? Lie in bed with your feet raised (elevated). ? Place a cold pack on your lower abdomen. ? Rest as much as possible until the bleeding stops or slows down.  Do not try to lose weight until the bleeding has stopped and your blood iron level is back to normal. Other Instructions  For two months, write down: ? When your period starts. ? When your period ends. ? When any abnormal bleeding occurs. ? What problems you notice.  Keep all follow-up visits as told by your health care provider. This is important. Contact a health care provider if:  You get light-headed or weak.  You have nausea and vomiting.  You cannot eat or drink without vomiting.  You feel dizzy or have diarrhea while you are taking medicine.  You are taking birth control pills or hormones, and you want to change them or stop taking them. Get help right away if:  You develop a fever or chills.  You need to change your sanitary pad or tampon more than one time per hour.  Your bleeding becomesheavy.  Your flow contains clots.  You develop pain in your abdomen.  You lose consciousness.  You develop a rash. This information is not intended to replace advice given to you by your health care provider. Make sure you discuss any questions you have with your health care provider. Document Released: 02/14/2005 Document Revised: 07/20/2015 Document Reviewed: 05/12/2014 Elsevier Interactive Patient Education  2018 [employer].  Vaginal Yeast infection, Adult Vaginal yeast infection is a condition that causes soreness,  swelling, and redness (inflammation) of the vagina. It also causes vaginal discharge. This is a common condition. Some women get this infection frequently. What are the causes? This condition is caused by a change in the normal balance of the yeast (candida) and bacteria that live in the vagina. This change causes an overgrowth of yeast,  which causes the inflammation. What increases the risk? This condition is more likely to develop in:  Women who take antibiotic medicines.  Women who have diabetes.  Women who take birth control pills.  Women who are pregnant.  Women who douche often.  Women who have a weak defense (immune) system.  Women who have been taking steroid medicines for a long time.  Women who frequently wear tight clothing.  What are the signs or symptoms? Symptoms of this condition include:  White, thick vaginal discharge.  Swelling, itching, redness, and irritation of the vagina. The lips of the vagina (vulva) may be affected as well.  Pain or a burning feeling while urinating.  Pain during sex.  How is this diagnosed? This condition is diagnosed with a medical history and physical exam. This will include a pelvic exam. Your health care provider will examine a sample of your vaginal discharge under a microscope. Your health care provider may send this sample for testing to confirm the diagnosis. How is this treated? This condition is treated with medicine. Medicines may be over-the-counter or prescription. You may be told to use one or more of the following:  Medicine that is taken orally.  Medicine that is applied as a cream.  Medicine that is inserted directly into the vagina (suppository).  Follow these instructions at home:  Take or apply over-the-counter and prescription medicines only as told by your health care provider.  Do not have sex until your health care provider has approved. Tell your sex partner that you have a yeast infection. That person should go to his or her health care provider if he or she develops symptoms.  Do not wear tight clothes, such as pantyhose or tight pants.  Avoid using tampons until your health care provider approves.  Eat more yogurt. This may help to keep your yeast infection from returning.  Try taking a sitz bath to help with discomfort. This  is a warm water bath that is taken while you are sitting down. The water should only come up to your hips and should cover your buttocks. Do this 3-4 times per day or as told by your health care provider.  Do not douche.  Wear breathable, cotton underwear.  If you have diabetes, keep your blood sugar levels under control. Contact a health care provider if:  You have a fever.  Your symptoms go away and then return.  Your symptoms do not get better with treatment.  Your symptoms get worse.  You have new symptoms.  You develop blisters in or around your vagina.  You have blood coming from your vagina and it is not your menstrual period.  You develop pain in your abdomen. This information is not intended to replace advice given to you by your health care provider. Make sure you discuss any questions you have with your health care provider. Document Released: 11/24/2004 Document Revised: 07/29/2015 Document Reviewed: 08/18/2014 Elsevier Interactive Patient Education  2018 [employer]

## 2017-06-15 NOTE — Progress Notes (Addendum)
06/15/2017 at 4:15 PM.  Subjective:    Patient ID: Shannon Bowers, female    DOB: [birth year], 47 y.o.   MRN: [id number] Chief Complaint  Patient presents with  . Annual Exam    HPI Shannon Bowers is a 47 y.o. female who presents to Primary Care at [facility] for an annual exam.  She notes that her menses are 5-40 days apart and the heaviness is dictated by how long it has been since her last period.   Primary Preventative Screenings: Cervical Cancer:  Her last PAP was in 2016, negative for HPV. Repeat in 2021.  Family Planning: G2P2003 None, would be fine with more children. Had an IUD placed x 3.5 yrs after her last child in 2012 (boy) but removed to try for more kids. STI screening: Married in a monogamous relationship.  Breast Cancer: mam 11/30/2015  Colorectal Cancer: NA due to age  Tobacco use/EtOH/substances: None  Bone Density:  Cardiac:  Weight/Blood sugar/Diet/Exercise:   BMI Readings from Last 3 Encounters:  06/15/17 32.28 kg/m  03/18/16 34.05 kg/m  10/15/15 34.02 kg/m   Lab Results  Component Value Date   HGBA1C 5.4 07/17/2014   OTC/Vit/Supp/Herbal: Dentist/Optho: Immunizations:  Immunization History  Administered Date(s) Administered  . Tdap 07/17/2014    Had twins with c-section initially, then 3rd child 05/2010 by c-section - boy H/o gestational DM  Chronic Medical Conditions: Patient Active Problem List   Diagnosis Date Noted  . Metrorrhagia 06/15/2017  . Class 1 obesity due to excess calories without serious comorbidity with body mass index (BMI) of 32.0 to 32.9 in adult 06/15/2017  . Mixed hyperlipidemia 06/15/2017  . Vitamin D deficiency 07/18/2014    History reviewed. No pertinent past medical history. Past Surgical History:  Procedure Laterality Date  . CESAREAN SECTION    . CIRCUMCISION N/A 1980   Current Outpatient Medications on File Prior to Visit  Medication Sig Dispense Refill  . benzonatate (TESSALON) 100 MG capsule Take 1  capsule (100 mg total) by mouth 2 (two) times daily as needed for cough. (Patient not taking: Reported on 06/15/2017) 20 capsule 0   No current facility-administered medications on file prior to visit.    No Known Allergies Family History  Problem Relation Age of Onset  . Diabetes Brother   . Hypertension Brother    Social History   Socioeconomic History  . Marital status: Married    Spouse name: Not on file  . Number of children: Not on file  . Years of education: Not on file  . Highest education level: Not on file  Occupational History  . Not on file  Social Needs  . Financial resource strain: Not on file  . Food insecurity:    Worry: Not on file    Inability: Not on file  . Transportation needs:    Medical: Not on file    Non-medical: Not on file  Tobacco Use  . Smoking status: Never Smoker  . Smokeless tobacco: Never Used  Substance and Sexual Activity  . Alcohol use: No  . Drug use: No  . Sexual activity: Never  Lifestyle  . Physical activity:    Days per week: Not on file    Minutes per session: Not on file  . Stress: Not on file  Relationships  . Social connections:    Talks on phone: Not on file    Gets together: Not on file    Attends religious service: Not on file    Active member of  club or organization: Not on file    Attends meetings of clubs or organizations: Not on file    Relationship status: Not on file  Other Topics Concern  . Not on file  Social History Narrative  . Not on file   Depression screen [facility] 2/9 06/15/2017 03/18/2016 10/15/2015  Decreased Interest 0 0 0  Down, Depressed, Hopeless 0 0 0  PHQ - 2 Score 0 0 0    Review of Systems  Constitutional: Negative for chills and fever.  All other systems reviewed and are negative.      Objective:   Physical Exam  Constitutional: She is oriented to person, place, and time. She appears well-developed and well-nourished. No distress.  HENT:  Head: Normocephalic and atraumatic.  Right  Ear: Tympanic membrane, external ear and ear canal normal.  Left Ear: Tympanic membrane, external ear and ear canal normal.  Nose: Mucosal edema present. No rhinorrhea.  Mouth/Throat: Uvula is midline, oropharynx is clear and moist and mucous membranes are normal. No posterior oropharyngeal erythema.  Eyes: Pupils are equal, round, and reactive to light. Conjunctivae and EOM are normal. Right eye exhibits no discharge. Left eye exhibits no discharge. No scleral icterus.  Neck: Normal range of motion. Neck supple. No thyromegaly present.  Cardiovascular: Normal rate, regular rhythm, normal heart sounds and intact distal pulses.  Pulmonary/Chest: Effort normal and breath sounds normal. No respiratory distress. No breast swelling, tenderness, discharge or bleeding.  Abdominal: Soft. Bowel sounds are normal. There is no tenderness.  Genitourinary: Uterus normal. No breast swelling, tenderness, discharge or bleeding. Cervix exhibits no motion tenderness and no friability. Right adnexum displays no mass and no tenderness. Left adnexum displays no mass and no tenderness. Vaginal discharge found.  Genitourinary Comments: Bilateral breasts with some fibrocystic changes but no distinctly abnml masses palpable  Musculoskeletal: She exhibits no edema.  Lymphadenopathy:    She has no cervical adenopathy.  Neurological: She is alert and oriented to person, place, and time. She has normal reflexes.  Skin: Skin is warm and dry. She is not diaphoretic. No erythema.  Psychiatric: She has a normal mood and affect. Her behavior is normal.   Vitals:   06/15/17 1609  BP: 110/72  Pulse: 86  Resp: 18  Temp: 98.3 F (36.8 C)  TempSrc: Oral  SpO2: 99%  Weight: 218 lb 9.6 oz (99.2 kg)  Height: 5\' 9"  (1.753 m)    Visual Acuity Screening   Right eye Left eye Both eyes  Without correction: 20/40 20/40 20/20   With correction:       Assessment & Plan:   1. Annual physical exam   2. Screening for  cardiovascular, respiratory, and genitourinary diseases   3. Screening for deficiency anemia   4. Screening for thyroid disorder   5. Encounter for screening mammogram for breast cancer   6. Microscopic hematuria   7. Vitamin D deficiency   8. Mixed hyperlipidemia - slightly worse but HDL is fantastic so 10 yr ASCVD risk is AT optimal of 0.6% - no statin indicated - lifetime ASCVD risk 39%  9. Class 1 obesity due to excess calories without serious comorbidity with body mass index (BMI) of 32.0 to 32.9 in adult   10. Metrorrhagia   11. Screening for cervical cancer - did pap today  12. Vaginal moniliasis - appears clinically though no yeast was seen on wet prep. However, there were a few clue cells but as nothing was seen on pap and pt denied bothersome discharge, I  think we will hold off on BV treatment for now.   [facility]   Orders Placed This Encounter  Procedures  . MM Digital Screening    Standing Status:   Future    Standing Expiration Date:   08/29/2018    Order Specific Question:   Reason for Exam (SYMPTOM  OR DIAGNOSIS REQUIRED)    Answer:   screening    Order Specific Question:   Is the patient pregnant?    Answer:   No    Order Specific Question:   Preferred imaging location?    Answer[facility]  . VITAMIN D 25 Hydroxy (Vit-D Deficiency, Fractures)  . CBC with Differential/Platelet  . Comprehensive metabolic panel  . TSH  . Lipid panel    Order Specific Question:   Has the patient fasted?    Answer:   Yes  . Hemoglobin A1c  . FSH/LH  . CBC with Differential/Platelet  . Comprehensive metabolic panel  . TSH  . VITAMIN D 25 Hydroxy (Vit-D Deficiency, Fractures)  . POCT urinalysis dipstick  . POCT Microscopic Urinalysis ([facility])  . POCT Wet + KOH Prep    Meds ordered this encounter  Medications  . fluconazole (DIFLUCAN) 150 MG tablet    Sig: Take 1 tablet (150 mg total) by mouth once for 1 dose.    Dispense:  1 tablet    Refill:   0    Norberto SorensonEva Shaw, M.D.  [facility] at [facility]  [place] [address] [place], [place] [address] [phone] phone [phone] fax  06/28/17 11:00 PM

## 2017-06-16 LAB — CBC WITH DIFFERENTIAL/PLATELET
BASOS ABS: 0 10*3/uL (ref 0.0–0.2)
Basos: 0 %
EOS (ABSOLUTE): 0 10*3/uL (ref 0.0–0.4)
Eos: 1 %
HEMOGLOBIN: 12.5 g/dL (ref 11.1–15.9)
Hematocrit: 37.7 % (ref 34.0–46.6)
Immature Grans (Abs): 0 10*3/uL (ref 0.0–0.1)
Immature Granulocytes: 0 %
LYMPHS ABS: 3 10*3/uL (ref 0.7–3.1)
Lymphs: 41 %
MCH: 28.9 pg (ref 26.6–33.0)
MCHC: 33.2 g/dL (ref 31.5–35.7)
MCV: 87 fL (ref 79–97)
MONOCYTES: 6 %
Monocytes Absolute: 0.4 10*3/uL (ref 0.1–0.9)
Neutrophils Absolute: 3.8 10*3/uL (ref 1.4–7.0)
Neutrophils: 52 %
Platelets: 471 10*3/uL — ABNORMAL HIGH (ref 150–379)
RBC: 4.33 x10E6/uL (ref 3.77–5.28)
RDW: 14.4 % (ref 12.3–15.4)
WBC: 7.4 10*3/uL (ref 3.4–10.8)

## 2017-06-16 LAB — COMPREHENSIVE METABOLIC PANEL
ALK PHOS: 71 IU/L (ref 39–117)
ALT: 18 IU/L (ref 0–32)
AST: 16 IU/L (ref 0–40)
Albumin/Globulin Ratio: 1.2 (ref 1.2–2.2)
Albumin: 4.2 g/dL (ref 3.5–5.5)
BUN/Creatinine Ratio: 16 (ref 9–23)
BUN: 9 mg/dL (ref 6–24)
Bilirubin Total: 0.3 mg/dL (ref 0.0–1.2)
CO2: 24 mmol/L (ref 20–29)
CREATININE: 0.57 mg/dL (ref 0.57–1.00)
Calcium: 9.6 mg/dL (ref 8.7–10.2)
Chloride: 102 mmol/L (ref 96–106)
GFR calc Af Amer: 129 mL/min/{1.73_m2} (ref >59)
GFR calc non Af Amer: 112 mL/min/{1.73_m2} (ref >59)
GLUCOSE: 84 mg/dL (ref 65–99)
Globulin, Total: 3.5 g/dL (ref 1.5–4.5)
Potassium: 4.5 mmol/L (ref 3.5–5.2)
Sodium: 141 mmol/L (ref 134–144)
Total Protein: 7.7 g/dL (ref 6.0–8.5)

## 2017-06-16 LAB — VITAMIN D 25 HYDROXY (VIT D DEFICIENCY, FRACTURES): VIT D 25 HYDROXY: 23.1 ng/mL — AB (ref 30.0–100.0)

## 2017-06-16 LAB — HEMOGLOBIN A1C
Est. average glucose Bld gHb Est-mCnc: 108 mg/dL
HEMOGLOBIN A1C: 5.4 % (ref 4.8–5.6)

## 2017-06-16 LAB — LIPID PANEL
CHOLESTEROL TOTAL: 260 mg/dL — AB (ref 100–199)
Chol/HDL Ratio: 3.5 ratio (ref 0.0–4.4)
HDL: 74 mg/dL (ref >39)
LDL Calculated: 169 mg/dL — ABNORMAL HIGH (ref 0–99)
Triglycerides: 86 mg/dL (ref 0–149)
VLDL Cholesterol Cal: 17 mg/dL (ref 5–40)

## 2017-06-16 LAB — FSH/LH
FSH: 7.7 m[IU]/mL
LH: 14 m[IU]/mL

## 2017-06-16 LAB — TSH: TSH: 0.908 u[IU]/mL (ref 0.450–4.500)

## 2017-06-18 LAB — PAP IG AND CT-NG NAA
CHLAMYDIA, NUC. ACID AMP: NEGATIVE
Gonococcus by Nucleic Acid Amp: NEGATIVE
PAP Smear Comment: 0

## 2017-06-28 ENCOUNTER — Encounter: Payer: Self-pay | Admitting: Family Medicine

## 2017-06-28 DIAGNOSIS — Z8632 Personal history of gestational diabetes: Secondary | ICD-10-CM | POA: Insufficient documentation

## 2017-06-29 ENCOUNTER — Encounter: Payer: Self-pay | Admitting: *Deleted

## 2018-03-01 ENCOUNTER — Ambulatory Visit (INDEPENDENT_AMBULATORY_CARE_PROVIDER_SITE_OTHER): Payer: 59

## 2018-03-01 ENCOUNTER — Ambulatory Visit (INDEPENDENT_AMBULATORY_CARE_PROVIDER_SITE_OTHER): Payer: 59 | Admitting: Emergency Medicine

## 2018-03-01 ENCOUNTER — Encounter: Payer: Self-pay | Admitting: Emergency Medicine

## 2018-03-01 ENCOUNTER — Other Ambulatory Visit: Payer: Self-pay

## 2018-03-01 VITALS — BP 132/88 | HR 106 | Temp 98.4°F | Resp 16 | Ht 68.0 in | Wt 216.2 lb

## 2018-03-01 DIAGNOSIS — R059 Cough, unspecified: Secondary | ICD-10-CM

## 2018-03-01 DIAGNOSIS — R05 Cough: Secondary | ICD-10-CM

## 2018-03-01 DIAGNOSIS — J22 Unspecified acute lower respiratory infection: Secondary | ICD-10-CM | POA: Insufficient documentation

## 2018-03-01 MED ORDER — BENZONATATE 200 MG PO CAPS: 200.0000 mg | ORAL_CAPSULE | Freq: Two times a day (BID) | ORAL | 0 refills | Status: AC | PRN

## 2018-03-01 MED ORDER — AZITHROMYCIN 250 MG PO TABS: ORAL_TABLET | ORAL | 0 refills | Status: DC

## 2018-03-01 NOTE — Patient Instructions (Addendum)
     If you have lab work done today you will be contacted with your lab results within the next 2 weeks.  If you have not heard from us then please contact us. The fastest way to get your results is to register for My Chart.   IF you received an x-ray today, you will receive an invoice from [facility]. Please contact [facility] at [phone] with questions or concerns regarding your invoice.   IF you received labwork today, you will receive an invoice from [facility]. Please contact [facility] at [phone] with questions or concerns regarding your invoice.   Our billing staff will not be able to assist you with questions regarding bills from these companies.  You will be contacted with the lab results as soon as they are available. The fastest way to get your results is to activate your My Chart account. Instructions are located on the last page of this paperwork. If you have not heard from us regarding the results in 2 weeks, please contact this office.     Cough, Adult  A cough helps to clear your throat and lungs. A cough may last only 2-3 weeks (acute), or it may last longer than 8 weeks (chronic). Many different things can cause a cough. A cough may be a sign of an illness or another medical condition. Follow these instructions at home:  Pay attention to any changes in your cough.  Take medicines only as told by your doctor. ? If you were prescribed an antibiotic medicine, take it as told by your doctor. Do not stop taking it even if you start to feel better. ? Talk with your doctor before you try using a cough medicine.  Drink enough fluid to keep your pee (urine) clear or pale yellow.  If the air is dry, use a cold steam vaporizer or humidifier in your home.  Stay away from things that make you cough at work or at home.  If your cough is worse at night, try using extra pillows to raise your head up higher while you sleep.  Do not smoke, and try not to  be around smoke. If you need help quitting, ask your doctor.  Do not have caffeine.  Do not drink alcohol.  Rest as needed. Contact a doctor if:  You have new problems (symptoms).  You cough up yellow fluid (pus).  Your cough does not get better after 2-3 weeks, or your cough gets worse.  Medicine does not help your cough and you are not sleeping well.  You have pain that gets worse or pain that is not helped with medicine.  You have a fever.  You are losing weight and you do not know why.  You have night sweats. Get help right away if:  You cough up blood.  You have trouble breathing.  Your heartbeat is very fast. This information is not intended to replace advice given to you by your health care provider. Make sure you discuss any questions you have with your health care provider. Document Released: 10/28/2010 Document Revised: 07/23/2015 Document Reviewed: 04/23/2014 Elsevier Interactive Patient Education  2019 [employer] Inc[employer]  

## 2018-03-01 NOTE — Progress Notes (Signed)
Shannon Bowers 48 y.o.   Chief Complaint  Patient presents with  . Cough    x 2 weeks productive with yellow and green mucus    HISTORY OF PRESENT ILLNESS: This is a 48 y.o. female complaining of productive cough for the past 2 weeks.  No other significant symptoms.  Denies fever or chills.  Denies difficulty breathing or wheezing.  Denies chest pain.  Denies nausea or vomiting.  Non-smoker.  No history of chronic medical conditions.  HPI   Prior to Admission medications   Not on File    No Known Allergies  Patient Active Problem List   Diagnosis Date Noted  . History of gestational diabetes mellitus, not currently pregnant 06/28/2017  . Metrorrhagia 06/15/2017  . Class 1 obesity due to excess calories without serious comorbidity with body mass index (BMI) of 32.0 to 32.9 in adult 06/15/2017  . Mixed hyperlipidemia 06/15/2017  . Vitamin D deficiency 07/18/2014    No past medical history on file.  Past Surgical History:  Procedure Laterality Date  . CESAREAN SECTION    . CIRCUMCISION N/A 1980    Social History   Socioeconomic History  . Marital status: Married    Spouse name: Not on file  . Number of children: 2  . Years of education: 28  . Highest education level: High school graduate  Occupational History  . 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 Financial resource strain: Not on file  . Food insecurity:    Worry: Not on file    Inability: Not on file  . Transportation needs:    Medical: Not on file    Non-medical: Not on file  Tobacco Use  . Smoking status: Never Smoker  . Smokeless tobacco: Never Used  Substance and Sexual Activity  . Alcohol use: No  . Drug use: Never  . Sexual activity: Yes    Birth control/protection: None    Comment: would be fine with more children, is in a married monogamous relationship  Lifestyle  . Physical activity:    Days per week: Not on file    Minutes per session: Not on file  . Stress: Not on file    Relationships  . Social connections:    Talks on phone: Not on file    Gets together: Not on file    Attends religious service: Not on file    Active member of club or organization: Not on file    Attends meetings of clubs or organizations: Not on file    Relationship status: Not on file  . Intimate partner violence:    Fear of current or ex partner: Not on file    Emotionally abused: Not on file    Physically abused: Not on file    Forced sexual activity: Not on file  Other Topics Concern  . Not on file  Social History Narrative  . Not on file    Family History  Problem Relation Age of Onset  . Diabetes Brother   . Hypertension Brother      Review of Systems  Constitutional: Negative.  Negative for chills and fever.  HENT: Negative.  Negative for sore throat.   Eyes: Negative.  Negative for blurred vision.  Respiratory: Positive for cough and sputum production. Negative for hemoptysis, shortness of breath and wheezing.   Cardiovascular: Negative.  Negative for chest pain and palpitations.  Gastrointestinal: Negative.  Negative for abdominal pain, blood in stool, diarrhea, melena, nausea and vomiting.  Genitourinary: Negative.  Negative for dysuria and hematuria.  Musculoskeletal: Negative.  Negative for back pain, joint pain and myalgias.  Skin: Negative.  Negative for rash.  Neurological: Negative.  Negative for dizziness, sensory change, focal weakness and headaches.  Endo/Heme/Allergies: Negative.   All other systems reviewed and are negative.   Vitals:   03/01/18 0847  BP: 132/88  Pulse: (!) 106  Resp: 16  Temp: 98.4 F (36.9 C)  SpO2: 96%    Physical Exam Vitals signs reviewed.  Constitutional:      Appearance: Normal appearance.  HENT:     Head: Normocephalic and atraumatic.     Nose: Nose normal.     Mouth/Throat:     Mouth: Mucous membranes are moist.     Pharynx: Oropharynx is clear.  Eyes:     Extraocular Movements: Extraocular movements  intact.     Conjunctiva/sclera: Conjunctivae normal.     Pupils: Pupils are equal, round, and reactive to light.  Neck:     Musculoskeletal: Normal range of motion and neck supple.  Cardiovascular:     Rate and Rhythm: Normal rate and regular rhythm.     Heart sounds: Normal heart sounds.  Pulmonary:     Effort: Pulmonary effort is normal.     Breath sounds: Normal breath sounds.  Musculoskeletal: Normal range of motion.  Skin:    General: Skin is warm and dry.  Neurological:     General: No focal deficit present.     Mental Status: She is alert and oriented to person, place, and time.  Psychiatric:        Mood and Affect: Mood normal.        Behavior: Behavior normal.    Dg Chest 2 View  Result Date: 03/01/2018 CLINICAL DATA:  Cough, possible pneumonia. EXAM: CHEST - 2 VIEW COMPARISON:  PA and lateral chest x-ray of March 01, 2007 FINDINGS: The lungs are well-expanded. There is no focal infiltrate. There is no pleural effusion. Heart and pulmonary vascularity are normal. The mediastinum is normal width. The trachea midline. The bony thorax is unremarkable. IMPRESSION: There is no pneumonia nor other acute cardiopulmonary abnormality. Electronically Signed   By: David  [place] M.D.   On: 03/01/2018 09:09     ASSESSMENT & PLAN: Sherley was seen today for cough.  Diagnoses and all orders for this visit:  Cough -     DG Chest 2 View; Future -     benzonatate (TESSALON) 200 MG capsule; Take 1 capsule (200 mg total) by mouth 2 (two) times daily as needed for cough.  Lower respiratory infection -     azithromycin (ZITHROMAX) 250 MG tablet; Sig as indicated    Patient Instructions       If you have lab work done today you will be contacted with your lab results within the next 2 weeks.  If you have not heard from [place] then please contact us. The fastest way to get your results is to register for My Chart.   IF you received an x-ray today, you will receive an invoice from  [facility]. Please contact [facility] at [phone] with questions or concerns regarding your invoice.   IF you received labwork today, you will receive an invoice from [facility]. Please contact [facility] at [phone] with questions or concerns regarding your invoice.   Our billing staff will not be able to assist you with questions regarding bills from these companies.  You will be contacted with the lab results as soon as they  are available. The fastest way to get your results is to activate your My Chart account. Instructions are located on the last page of this paperwork. If you have not heard from us regarding the results in 2 weeks, please contact this office.     Cough, Adult  A cough helps to clear your throat and lungs. A cough may last only 2-3 weeks (acute), or it may last longer than 8 weeks (chronic). Many different things can cause a cough. A cough may be a sign of an illness or another medical condition. Follow these instructions at home:  Pay attention to any changes in your cough.  Take medicines only as told by your doctor. ? If you were prescribed an antibiotic medicine, take it as told by your doctor. Do not stop taking it even if you start to feel better. ? Talk with your doctor before you try using a cough medicine.  Drink enough fluid to keep your pee (urine) clear or pale yellow.  If the air is dry, use a cold steam vaporizer or humidifier in your home.  Stay away from things that make you cough at work or at home.  If your cough is worse at night, try using extra pillows to raise your head up higher while you sleep.  Do not smoke, and try not to be around smoke. If you need help quitting, ask your doctor.  Do not have caffeine.  Do not drink alcohol.  Rest as needed. Contact a doctor if:  You have new problems (symptoms).  You cough up yellow fluid (pus).  Your cough does not get better after 2-3 weeks, or your cough gets  worse.  Medicine does not help your cough and you are not sleeping well.  You have pain that gets worse or pain that is not helped with medicine.  You have a fever.  You are losing weight and you do not know why.  You have night sweats. Get help right away if:  You cough up blood.  You have trouble breathing.  Your heartbeat is very fast. This information is not intended to replace advice given to you by your health care provider. Make sure you discuss any questions you have with your health care provider. Document Released: 10/28/2010 Document Revised: 07/23/2015 Document Reviewed: 04/23/2014 Elsevier Interactive Patient Education  2019 Elsevier Inc.      Edwina BarthMiguel Martrice Apt, MD [facility]

## 2019-11-28 ENCOUNTER — Other Ambulatory Visit: Payer: Self-pay

## 2019-11-28 DIAGNOSIS — Z20822 Contact with and (suspected) exposure to covid-19: Secondary | ICD-10-CM

## 2019-11-30 LAB — NOVEL CORONAVIRUS, NAA

## 2019-12-03 ENCOUNTER — Other Ambulatory Visit: Payer: 59

## 2019-12-03 DIAGNOSIS — Z20822 Contact with and (suspected) exposure to covid-19: Secondary | ICD-10-CM

## 2019-12-04 LAB — SARS-COV-2, NAA 2 DAY TAT

## 2019-12-04 LAB — NOVEL CORONAVIRUS, NAA: SARS-CoV-2, NAA: NOT DETECTED

## 2022-05-12 ENCOUNTER — Encounter (HOSPITAL_COMMUNITY): Payer: Self-pay | Admitting: Emergency Medicine

## 2022-05-12 ENCOUNTER — Ambulatory Visit (HOSPITAL_COMMUNITY)
Admission: EM | Admit: 2022-05-12 | Discharge: 2022-05-12 | Disposition: A | Discharge status: Home / Self Care | Payer: 59 | Attending: Family Medicine | Admitting: Family Medicine

## 2022-05-12 ENCOUNTER — Other Ambulatory Visit: Payer: Self-pay

## 2022-05-12 DIAGNOSIS — J029 Acute pharyngitis, unspecified: Secondary | ICD-10-CM | POA: Diagnosis not present

## 2022-05-12 DIAGNOSIS — H1033 Unspecified acute conjunctivitis, bilateral: Secondary | ICD-10-CM | POA: Diagnosis not present

## 2022-05-12 DIAGNOSIS — H9201 Otalgia, right ear: Secondary | ICD-10-CM | POA: Diagnosis not present

## 2022-05-12 MED ORDER — LIDOCAINE VISCOUS HCL 2 % MT SOLN: 5.0000 mL | Freq: Four times a day (QID) | OROMUCOSAL | 0 refills | Status: AC | PRN

## 2022-05-12 MED ORDER — TOBRAMYCIN 0.3 % OP SOLN: 1.0000 [drp] | Freq: Four times a day (QID) | OPHTHALMIC | 0 refills | Status: AC

## 2022-05-12 MED ORDER — AMOXICILLIN 875 MG PO TABS: 875.0000 mg | ORAL_TABLET | Freq: Two times a day (BID) | ORAL | 0 refills | Status: AC

## 2022-05-12 NOTE — ED Triage Notes (Signed)
Pt also c/o right ear pain since Monday.

## 2022-05-12 NOTE — ED Provider Notes (Signed)
[place]   [id number] 05/12/22 Arrival Time: [id number]  ASSESSMENT & PLAN:  1. Acute otalgia, right   2. Sore throat   3. Acute conjunctivitis of both eyes, unspecified acute conjunctivitis type    No swallowing difficulties. Discussed typical duration of likely viral illness. OTC symptom care as needed. Suspect developing R OM; will begin treatment.  Discharge Medication List as of 05/12/2022 11:12 AM     START taking these medications   Details  amoxicillin (AMOXIL) 875 MG tablet Take 1 tablet (875 mg total) by mouth 2 (two) times daily for 10 days., Starting Thu 05/12/2022, Until Sun 05/22/2022, Normal    magic mouthwash (lidocaine, diphenhydrAMINE, alum & mag hydroxide) suspension Swish and spit 5 mLs 4 (four) times daily as needed for mouth pain., Starting Thu 05/12/2022, Normal    tobramycin (TOBREX) 0.3 % ophthalmic solution Place 1 drop into both eyes every 6 (six) hours., Starting Thu 05/12/2022, Normal         Follow-up Information     [facility].   Specialty: Urgent Care Why: If worsening or failing to improve as anticipated. Contact information: [place] [id number] [phone]                Reviewed expectations re: course of current medical issues. Questions answered. Outlined signs and symptoms indicating need for more acute intervention. Understanding verbalized. After Visit Summary given.   SUBJECTIVE: History from: Patient and Family. Shannon Bowers is a 52 y.o. female. Reports: Pt c/o headache, eye irritation and swollen on her bilateral side of the neck since Monday.  Pt also c/o right ear pain since Monday. No ear drainage/bleeding. . Denies: fever. Normal PO intake without n/v/d.  OBJECTIVE:  Vitals:   05/12/22 1046 05/12/22 1047  Pulse: 99   Resp: 18   Temp: 98.2 F (36.8 C)   TempSrc: Oral   SpO2: 98%   Weight:  98.1 kg  Height:  '5\' 8"'$  (1.727 m)     General appearance: alert; no distress Eyes: PERRLA; EOMI; conjunctivae with 2+ injection and watery drainage HENT: ; AT; with nasal congestion; throat with mod cobblestoning; R TM with erythema and thick yellow fluid behind Neck: supple  Lungs: speaks full sentences without difficulty; unlabored Extremities: no edema Skin: warm and dry Neurologic: normal gait Psychological: alert and cooperative; normal mood and affect    No Known Allergies  History reviewed. No pertinent past medical history. 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  Tobacco Use   Smoking status: Never   Smokeless tobacco: Never  Vaping Use   Vaping Use: Never used  Substance and Sexual Activity   Alcohol use: No   Drug use: Never   Sexual activity: Yes    Birth control/protection: None    Comment: would be fine with more children, is in a married monogamous relationship  Other Topics Concern   Not on file  Social History Narrative   Not on file   Social Determinants of Health   Financial Resource Strain: Not on file  Food Insecurity: Not on file  Transportation Needs: Not on file  Physical Activity: Not on file  Stress: Not on file  Social Connections: Not on file  Intimate Partner  Violence: Not on file   Family History  Problem Relation Age of Onset   Diabetes Brother    Hypertension Brother    Past Surgical History:  Procedure Laterality Date   CESAREAN SECTION     CIRCUMCISION N/A 1980     Vanessa Kick, MD 05/12/22 1701

## 2022-05-12 NOTE — ED Triage Notes (Signed)
Pt c/o headache, eye irritation and swollen on her bilateral side of the neck since Monday.
